# Patient Record
Sex: Female | Born: 2005 | Race: White | Hispanic: No | Marital: Single | Smoking: Never smoker
Health system: Southern US, Community
[De-identification: ages and names within clinical notes are randomized; demographics above are authoritative.]

## PROBLEM LIST (undated history)

## (undated) DIAGNOSIS — F319 Bipolar disorder, unspecified: Secondary | ICD-10-CM

## (undated) DIAGNOSIS — J3089 Other allergic rhinitis: Secondary | ICD-10-CM

## (undated) DIAGNOSIS — F988 Other specified behavioral and emotional disorders with onset usually occurring in childhood and adolescence: Secondary | ICD-10-CM

## (undated) DIAGNOSIS — E282 Polycystic ovarian syndrome: Secondary | ICD-10-CM

## (undated) DIAGNOSIS — F32A Depression, unspecified: Secondary | ICD-10-CM

## (undated) DIAGNOSIS — E7800 Pure hypercholesterolemia, unspecified: Secondary | ICD-10-CM

## (undated) DIAGNOSIS — F419 Anxiety disorder, unspecified: Secondary | ICD-10-CM

## (undated) DIAGNOSIS — M549 Dorsalgia, unspecified: Secondary | ICD-10-CM

## (undated) DIAGNOSIS — R7303 Prediabetes: Secondary | ICD-10-CM

## (undated) HISTORY — DX: Other allergic rhinitis: J30.89

## (undated) HISTORY — DX: Other specified behavioral and emotional disorders with onset usually occurring in childhood and adolescence: F98.8

## (undated) HISTORY — DX: Dorsalgia, unspecified: M54.9

## (undated) HISTORY — DX: Prediabetes: R73.03

## (undated) HISTORY — DX: Bipolar disorder, unspecified: F31.9

## (undated) HISTORY — DX: Polycystic ovarian syndrome: E28.2

## (undated) HISTORY — PX: MOUTH SURGERY: SHX715

## (undated) HISTORY — DX: Pure hypercholesterolemia, unspecified: E78.00

---
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
## 2023-03-16 ENCOUNTER — Ambulatory Visit: Payer: Medicaid Other | Admitting: Family Medicine

## 2023-03-16 ENCOUNTER — Encounter: Payer: Self-pay | Admitting: Family Medicine

## 2023-03-16 VITALS — BP 107/73 | HR 87 | Temp 98.1°F | Resp 16 | Ht 62.0 in | Wt 187.0 lb

## 2023-03-16 DIAGNOSIS — J209 Acute bronchitis, unspecified: Secondary | ICD-10-CM | POA: Diagnosis not present

## 2023-03-16 MED ORDER — BENZONATATE 100 MG PO CAPS: 100.0000 mg | ORAL_CAPSULE | Freq: Three times a day (TID) | ORAL | 0 refills | Status: DC | PRN

## 2023-03-16 MED ORDER — PREDNISONE 10 MG PO TABS: 10.0000 mg | ORAL_TABLET | Freq: Two times a day (BID) | ORAL | 0 refills | Status: DC

## 2023-03-16 NOTE — Progress Notes (Signed)
 Acute Office Visit  Subjective:     Patient ID: Nancy Stewart, female    DOB: 22-Oct-2005, 18 y.o.   MRN: [id number]  Chief Complaint  Patient presents with   Cough    Cough/  sinus congestion that has moved to the chest , pt report stuffy ears bi-lat,pt denies any any fevers , pt reports clear and green mucus .    HPI Patient is in today for an acute visit with cough, sinus congestion that feels like it is moving down to my chest. Ears feel stuffy, no pain, no hearing loss. Symptoms started on Friday.  No fever or chills. Does not feel ill.  No fatigue.  Reports some shortness of breath with climbing stairs. This is new. Walking on flat surface is fine.  Able to converse with provider without needing to stop. Frequent coughing during visit.   Review of Systems  Constitutional:  Negative for chills and fever.  HENT:  Positive for congestion. Negative for ear pain and hearing loss.   Respiratory:  Positive for cough and shortness of breath (with climbing stairs.).         Objective:    BP 107/73   Pulse 87   Temp 98.1 F (36.7 C) (Oral)   Resp 16   Ht 5' 2 (1.575 m)   Wt 187 lb (84.8 kg)   SpO2 99%   BMI 34.20 kg/m  BP Readings from Last 3 Encounters:  03/16/23 107/73  09/14/22 130/76  02/28/22 125/84      Physical Exam Vitals and nursing note reviewed.  Constitutional:      General: She is not in acute distress.    Appearance: Normal appearance.  HENT:     Right Ear: Tympanic membrane normal.     Left Ear: Tympanic membrane normal.     Nose:     Right Sinus: No maxillary sinus tenderness or frontal sinus tenderness.     Left Sinus: No maxillary sinus tenderness or frontal sinus tenderness.     Mouth/Throat:     Pharynx: Uvula midline. Posterior oropharyngeal erythema present. No oropharyngeal exudate.     Tonsils: 1+ on the right. 1+ on the left.  Cardiovascular:     Rate and Rhythm: Regular rhythm.     Heart sounds: Normal heart sounds.   Pulmonary:     Effort: Pulmonary effort is normal.     Breath sounds: Normal breath sounds.  Skin:    General: Skin is warm and dry.  Neurological:     General: No focal deficit present.     Mental Status: She is alert. Mental status is at baseline.  Psychiatric:        Mood and Affect: Mood normal.        Behavior: Behavior normal.        Thought Content: Thought content normal.        Judgment: Judgment normal.    No results found for any visits on 03/16/23.      Assessment & Plan:   Problem List Items Addressed This Visit     Acute bronchitis - Primary   Cough with URI symptoms present since Friday. No fever or chills. Does not feel ill. Lungs are clear. Oxygen saturation 99%. No obvious shortness of breath, reports some with climbing stairs. Prednisone  10 mg BID with food x 5 days. Benzonatate  100 mg TID for cough. Supportive therapy. Explained viral illness need to run their course. No indication today for chest x-ray. Follow-up if  symptoms are worse or still present in 7-10 days. Will send for chest x-ray at that time.       Relevant Medications   predniSONE  (DELTASONE ) 10 MG tablet   benzonatate  (TESSALON ) 100 MG capsule    Meds ordered this encounter  Medications   predniSONE  (DELTASONE ) 10 MG tablet    Sig: Take 1 tablet (10 mg total) by mouth 2 (two) times daily with a meal.    Dispense:  10 tablet    Refill:  0    Supervising Provider:   RUCKER, ALETHEA Y [[id number]]   benzonatate  (TESSALON ) 100 MG capsule    Sig: Take 1 capsule (100 mg total) by mouth 3 (three) times daily as needed for cough.    Dispense:  30 capsule    Refill:  0    Supervising Provider:   COLETTE TORRENCE GRADE [[id number]]  Agrees with plan of care discussed.  Questions answered.   Return if symptoms worsen or fail to improve.  Darice JONELLE Brownie, FNP

## 2023-03-16 NOTE — Assessment & Plan Note (Signed)
 Cough with URI symptoms present since Friday. No fever or chills. Does not feel ill. Lungs are clear. Oxygen saturation 99%. No obvious shortness of breath, reports some with climbing stairs. Prednisone  10 mg BID with food x 5 days. Benzonatate  100 mg TID for cough. Supportive therapy. Explained viral illness need to run their course. No indication today for chest x-ray. Follow-up if symptoms are worse or still present in 7-10 days. Will send for chest x-ray at that time.

## 2023-03-16 NOTE — Patient Instructions (Signed)
Viral illnesses can take up to 10 days to resolve.   There is no role for an antibiotic.  Symptomatic treatment is ideal.   Take over the counter pain medication as needed. Acetaminophen and ibuprofen for fever and body aches. Mucinex and Robitussin for cough, if you have high blood pressure, take Coricidin for cough. Honey is also effective for cough, avoid if diabetic. Flonase or saline nasal spray for nasal congestion.    Read and follow instructions on the label and make sure not to combine other medications that may have same ingredients in it. It is important to not take too much.    Drink plenty of caffeine-free fluids. (If you have heart or kidney problems, follow the instructions of your specialist regarding amounts). Adequate fluids will help you to avoid dehydration.   If you are hungry, eat a bland diet, such as the BRAT diet (bananas, rice, applesauce, toast). Diet as tolerated if appetite is normal.   Get lots of rest.  Let us know if you are not improving or getting worse.

## 2023-04-09 ENCOUNTER — Ambulatory Visit
Admission: EM | Admit: 2023-04-09 | Discharge: 2023-04-09 | Disposition: A | Discharge status: Home / Self Care | Payer: Medicaid Other | Attending: Family Medicine | Admitting: Family Medicine

## 2023-04-09 ENCOUNTER — Encounter: Payer: Self-pay | Admitting: Emergency Medicine

## 2023-04-09 ENCOUNTER — Other Ambulatory Visit: Payer: Self-pay

## 2023-04-09 DIAGNOSIS — R059 Cough, unspecified: Secondary | ICD-10-CM

## 2023-04-09 LAB — POC COVID19/FLU A&B COMBO
Covid Antigen, POC: NEGATIVE
Influenza A Antigen, POC: NEGATIVE
Influenza B Antigen, POC: NEGATIVE

## 2023-04-09 MED ORDER — PREDNISONE 20 MG PO TABS: ORAL_TABLET | ORAL | 0 refills | Status: DC

## 2023-04-09 NOTE — ED Provider Notes (Signed)
[facility]    CSN: [id number] Arrival date & time: 04/09/23  1239      History   Chief Complaint Chief Complaint  Patient presents with   Cough   Nasal Congestion    HPI Nancy Stewart is a 18 y.o. female.   HPI 18 year old female presents with cough, burning chest congestion and fatigue for 1 day.  PMH significant for obesity, depression, and nasal congestion with rhinorrhea.  Past Medical History:  Diagnosis Date   Anxiety    Depression    Environmental and seasonal allergies     Patient Active Problem List   Diagnosis Date Noted   Acute bronchitis 03/16/2023   Establishing care with new doctor, encounter for 09/14/2022   Nasal congestion with rhinorrhea 09/14/2022   Seasonal allergies 09/14/2022   Upper respiratory tract infection 09/14/2022    History reviewed. No pertinent surgical history.  OB History   No obstetric history on file.      Home Medications    Prior to Admission medications   Medication Sig Start Date End Date Taking? Authorizing Provider  CAPLYTA 42 MG capsule Take 42 mg by mouth at bedtime. 02/21/23  Yes [provider]  lamoTRIgine (LAMICTAL) 25 MG tablet Take 25 mg by mouth 2 (two) times daily. 02/20/23  Yes [provider]  predniSONE (DELTASONE) 20 MG tablet Take 3 tabs PO daily x 5 days. 04/09/23  Yes Trevor Iha, FNP    Family History Family History  Problem Relation Age of Onset   Healthy Mother    Healthy Father     Social History Social History   Tobacco Use   Smoking status: Never    Passive exposure: Never   Smokeless tobacco: Never  Vaping Use   Vaping status: Never Used  Substance Use Topics   Alcohol use: Never   Drug use: Never     Allergies   Patient has no known allergies.   Review of Systems Review of Systems  HENT:  Positive for congestion.   Respiratory:  Positive for cough.   All other systems reviewed and are negative.    Physical Exam Triage Vital  Signs ED Triage Vitals  Encounter Vitals Group     BP 04/09/23 1319 117/81     Systolic BP Percentile --      Diastolic BP Percentile --      Pulse Rate 04/09/23 1319 (!) 102     Resp 04/09/23 1319 16     Temp 04/09/23 1319 98.8 F (37.1 C)     Temp Source 04/09/23 1319 Oral     SpO2 04/09/23 1319 95 %     Weight --      Height --      Head Circumference --      Peak Flow --      Pain Score 04/09/23 1318 8     Pain Loc --      Pain Education --      Exclude from Growth Chart --    No data found.  Updated Vital Signs BP 117/81 (BP Location: Left Arm)   Pulse (!) 102   Temp 98.8 F (37.1 C) (Oral)   Resp 16   SpO2 95%    Physical Exam Vitals and nursing note reviewed.  Constitutional:      Appearance: Normal appearance. She is normal weight.  HENT:     Head: Normocephalic and atraumatic.     Right Ear: Tympanic membrane, ear canal and external ear  normal.     Left Ear: Tympanic membrane, ear canal and external ear normal.     Mouth/Throat:     Mouth: Mucous membranes are moist.     Pharynx: Oropharynx is clear.  Eyes:     Extraocular Movements: Extraocular movements intact.     Conjunctiva/sclera: Conjunctivae normal.     Pupils: Pupils are equal, round, and reactive to light.  Cardiovascular:     Rate and Rhythm: Normal rate and regular rhythm.     Pulses: Normal pulses.     Heart sounds: Normal heart sounds.  Pulmonary:     Effort: Pulmonary effort is normal.     Breath sounds: Normal breath sounds.  Musculoskeletal:        General: Normal range of motion.     Cervical back: Normal range of motion and neck supple.  Skin:    General: Skin is warm and dry.  Neurological:     General: No focal deficit present.     Mental Status: She is alert and oriented to person, place, and time. Mental status is at baseline.      UC Treatments / Results  Labs (all labs ordered are listed, but only abnormal results are displayed) Labs Reviewed  POC COVID19/FLU A&B  COMBO - Normal    EKG   Radiology No results found.  Procedures Procedures (including critical care time)  Medications Ordered in UC Medications - No data to display  Initial Impression / Assessment and Plan / UC Course  I have reviewed the triage vital signs and the nursing notes.  Pertinent labs & imaging results that were available during my care of the patient were reviewed by me and considered in my medical decision making (see chart for details).     Mdm: 1.  Cough, unspecified-Rx'd prednisone 20 mg tablet: Take 3 tablets p.o. daily x 5 days. Advised patient influenza/COVID-19 negative today.  Advised patient take medication as directed with food to completion.  Encouraged to increase daily water intake to 64 ounces per day while taking this medication.  Advised if symptoms worsen and/or unresolved please follow-up with your PCP.  Patient discharged home, hemodynamically stable Final Clinical Impressions(s) / UC Diagnoses   Final diagnoses:  Cough, unspecified type     Discharge Instructions      Advised patient influenza/COVID-19 negative today.  Advised patient take medication as directed with food to completion.  Encouraged to increase daily water intake to 64 ounces per day while taking this medication.  Advised if symptoms worsen and/or unresolved please follow-up with your PCP.     ED Prescriptions     Medication Sig Dispense Auth. Provider   predniSONE (DELTASONE) 20 MG tablet Take 3 tabs PO daily x 5 days. 15 tablet Trevor Iha, FNP      PDMP not reviewed this encounter.   Trevor Iha, FNP 04/09/23 1456

## 2023-04-09 NOTE — Discharge Instructions (Addendum)
Advised patient influenza/COVID-19 negative today.  Advised patient take medication as directed with food to completion.  Encouraged to increase daily water intake to 64 ounces per day while taking this medication.  Advised if symptoms worsen and/or unresolved please follow-up with your PCP.

## 2023-04-09 NOTE — ED Triage Notes (Signed)
Patient presents to Urgent Care with complaints of cough-burning, chest congestion, fatigued since 1 day ago. Patient reports not taking any medication otc.Marland Kitchen  Denies any fever or chills.

## 2023-04-10 ENCOUNTER — Encounter: Payer: Self-pay | Admitting: Family Medicine

## 2023-04-10 ENCOUNTER — Ambulatory Visit (INDEPENDENT_AMBULATORY_CARE_PROVIDER_SITE_OTHER): Payer: Medicaid Other | Admitting: Family Medicine

## 2023-04-10 VITALS — BP 134/75 | HR 92 | Temp 98.4°F | Resp 18 | Ht 62.0 in | Wt 184.7 lb

## 2023-04-10 DIAGNOSIS — R053 Chronic cough: Secondary | ICD-10-CM

## 2023-04-10 MED ORDER — AZITHROMYCIN 250 MG PO TABS: ORAL_TABLET | ORAL | 0 refills | Status: AC

## 2023-04-10 MED ORDER — ALBUTEROL SULFATE HFA 108 (90 BASE) MCG/ACT IN AERS: 2.0000 | INHALATION_SPRAY | Freq: Four times a day (QID) | RESPIRATORY_TRACT | 0 refills | Status: DC | PRN

## 2023-04-10 NOTE — Assessment & Plan Note (Addendum)
Persistent cough and symptoms since 03/15/13. Lungs are clear. No obvious shortness of breath or wheezing today. Reports shortness of breath with exertion.  Azithromycin 500 mg today, followed by 250 mg daily for next 4 days. Albuterol inhaler as instructed for shortness of breath. Oxygen saturation 98%.  Presumed upper respiratory infection, treat with antibiotics today. Follow-up if symptoms do not resolve. Explained course of cough and that it may last longer than illness. Complete prednisone given by urgent care.

## 2023-04-10 NOTE — Progress Notes (Signed)
Acute Office Visit  Subjective:     Patient ID: Nancy Stewart, female    DOB: Oct 10, 2005, 18 y.o.   MRN: [id number]  Chief Complaint  Patient presents with   Cough    Patient states that her cough started on Friday with a little congestion , by Saturday cough was worse. Patient states that cough is productive     HPI Patient is in today for productive cough with congestion. Symptoms started Friday. Went to urgent care yesterday and given prednisone 60 mg daily. Has taken one dose. Has not seen improvement in her cough.  May have spiked a fever on Friday, she did not have a way to test her. Reports exertional shortness of breath with extended walking. No obvious SOB in clinic today.  Treated for bronchitis on 03/16/23, symptoms improved at that time then returned.   03/16/23: acute bronchitis:  Prednisone 10 mg BID with benzonatate 100 mg TID as needed.       Review of Systems  Constitutional:  Positive for chills and fever.  HENT:  Positive for congestion.   Respiratory:  Positive for cough, sputum production and shortness of breath (with extended walking.). Negative for wheezing.         Objective:    BP 134/75   Pulse 92   Temp 98.4 F (36.9 C) (Oral)   Resp 18   Ht 5\' 2"  (1.575 m)   Wt 184 lb 11.2 oz (83.8 kg)   SpO2 98%   BMI 33.78 kg/m  BP Readings from Last 3 Encounters:  04/10/23 134/75  04/09/23 117/81  03/16/23 107/73      Physical Exam Vitals and nursing note reviewed.  Constitutional:      General: She is not in acute distress.    Appearance: Normal appearance. She is obese. She is not ill-appearing.  Cardiovascular:     Rate and Rhythm: Regular rhythm.     Heart sounds: Normal heart sounds.  Pulmonary:     Effort: Pulmonary effort is normal.     Breath sounds: Normal breath sounds.  Skin:    General: Skin is warm and dry.  Neurological:     General: No focal deficit present.     Mental Status: She is alert. Mental status is at  baseline.  Psychiatric:        Mood and Affect: Mood normal.        Behavior: Behavior normal.        Thought Content: Thought content normal.        Judgment: Judgment normal.    No results found for any visits on 04/10/23.      Assessment & Plan:   Problem List Items Addressed This Visit     Persistent cough for 3 weeks or longer - Primary   Persistent cough and symptoms since 03/15/13. Lungs are clear. No obvious shortness of breath or wheezing today. Reports shortness of breath with exertion.  Azithromycin 500 mg today, followed by 250 mg daily for next 4 days. Albuterol inhaler as instructed for shortness of breath. Oxygen saturation 98%.  Presumed upper respiratory infection, treat with antibiotics today. Follow-up if symptoms do not resolve. Explained course of cough and that it may last longer than illness. Complete prednisone given by urgent care.       Relevant Medications   azithromycin (ZITHROMAX) 250 MG tablet   albuterol (VENTOLIN HFA) 108 (90 Base) MCG/ACT inhaler    Meds ordered this encounter  Medications   azithromycin (ZITHROMAX)  250 MG tablet    Sig: Take 2 tablets on day 1, then 1 tablet daily on days 2 through 5    Dispense:  6 tablet    Refill:  0    Supervising Provider:   Suzan Slick [[id number]]   albuterol (VENTOLIN HFA) 108 (90 Base) MCG/ACT inhaler    Sig: Inhale 2 puffs into the lungs every 6 (six) hours as needed for wheezing or shortness of breath.    Dispense:  8 g    Refill:  0    Supervising Provider:   Suzan Slick [[id number]]  Agrees with plan of care discussed.  Questions answered.   Return if symptoms worsen or fail to improve.  Novella Olive, FNP

## 2023-04-10 NOTE — Patient Instructions (Signed)
  Take over the counter pain medication as needed. Acetaminophen and ibuprofen for fever and body aches. Mucinex and Robitussin for cough, if you have high blood pressure, take Coricidin for cough. Honey is also effective for cough, avoid if diabetic. Flonase or saline nasal spray for nasal congestion.    Read and follow instructions on the label and make sure not to combine other medications that may have same ingredients in it. It is important to not take too much.    Drink plenty of caffeine-free fluids. (If you have heart or kidney problems, follow the instructions of your specialist regarding amounts). Adequate fluids will help you to avoid dehydration.   If you are hungry, eat a bland diet, such as the BRAT diet (bananas, rice, applesauce, toast). Diet as tolerated if appetite is normal.   Get lots of rest.  Let us know if you are not improving or getting worse.

## 2023-04-11 ENCOUNTER — Encounter: Payer: Self-pay | Admitting: Family Medicine

## 2023-04-13 ENCOUNTER — Ambulatory Visit: Payer: Medicaid Other | Admitting: Family Medicine

## 2023-04-13 ENCOUNTER — Encounter: Payer: Self-pay | Admitting: Family Medicine

## 2023-04-13 VITALS — BP 101/65 | HR 90 | Temp 98.1°F | Resp 18 | Ht 62.0 in | Wt 185.8 lb

## 2023-04-13 DIAGNOSIS — J0100 Acute maxillary sinusitis, unspecified: Secondary | ICD-10-CM | POA: Diagnosis not present

## 2023-04-13 DIAGNOSIS — R053 Chronic cough: Secondary | ICD-10-CM | POA: Diagnosis not present

## 2023-04-13 MED ORDER — FLUTICASONE PROPIONATE 50 MCG/ACT NA SUSP: 2.0000 | Freq: Every day | NASAL | 6 refills | Status: DC

## 2023-04-13 MED ORDER — BENZONATATE 100 MG PO CAPS: 100.0000 mg | ORAL_CAPSULE | Freq: Two times a day (BID) | ORAL | 0 refills | Status: DC | PRN

## 2023-04-13 NOTE — Progress Notes (Signed)
Acute Office Visit  Subjective:     Patient ID: Nancy Stewart, female    DOB: 01-18-2006, 18 y.o.   MRN: [id number]  Chief Complaint  Patient presents with   Fever    Sinus congestion    HPI Patient is in today for continued congestion and fever. T-max: 102.1 last night x 1. Took ibuprofen 400 mg which helped. Feels like more in  her sinuses. Left side nostril with increased drainage.  Frequent cough in exam room, completed prednisone 60 mg daily for 5 days.  Day # 4 of azithromycin.  Drinking fluids No nausea or vomiting.  Day # 6 of symptoms with cough starting first.    Chart review:  03/16/23: bronchitis, prednisone  04/09/23: urgent care - prednisone 04/10/23: azithromycin and albuterol inhaler   Review of Systems  Constitutional:  Positive for fever.  HENT:  Positive for congestion and sinus pain.   Respiratory:  Positive for cough. Negative for shortness of breath and wheezing.         Objective:    BP 101/65 (BP Location: Left Arm, Cuff Size: Large)   Pulse 90   Temp 98.1 F (36.7 C)   Resp 18   Ht 5\' 2"  (1.575 m)   Wt 185 lb 12.8 oz (84.3 kg)   SpO2 98%   BMI 33.98 kg/m  BP Readings from Last 3 Encounters:  04/13/23 101/65  04/10/23 134/75  04/09/23 117/81      Physical Exam Vitals and nursing note reviewed.  Constitutional:      General: She is not in acute distress.    Appearance: Normal appearance. She is not ill-appearing.  HENT:     Right Ear: Tympanic membrane normal.     Left Ear: Tympanic membrane normal.     Nose:     Left Sinus: Maxillary sinus tenderness present.     Mouth/Throat:     Pharynx: Uvula midline. No oropharyngeal exudate, posterior oropharyngeal erythema or uvula swelling.  Cardiovascular:     Heart sounds: Normal heart sounds.  Pulmonary:     Effort: Pulmonary effort is normal.     Breath sounds: Normal breath sounds.  Skin:    General: Skin is warm and dry.  Neurological:     General: No focal deficit  present.     Mental Status: She is alert. Mental status is at baseline.  Psychiatric:        Mood and Affect: Mood normal.        Behavior: Behavior normal.        Thought Content: Thought content normal.        Judgment: Judgment normal.    No results found for any visits on 04/13/23.      Assessment & Plan:   Problem List Items Addressed This Visit     Persistent cough for 3 weeks or longer   Supportive therapy. Benzonatate 100 mg BID as needed. Elevate to sleep, humidify the air, and honey may also be effective to control cough.       Relevant Medications   benzonatate (TESSALON) 100 MG capsule   Acute non-recurrent maxillary sinusitis - Primary   Nasal congestion, left sided maxillary sinus pressure, fever x 1, symptoms are consistent with sinus infection. Azithromycin started on 04/10/23 by this provider.  This is day # 4 of treatment. Albuterol inhaler has helped shortness of breath. Completed prednisone 60 mg every day given by urgent care. This provider prescribed prednisone on 03/16/23 for bronchitis, will avoid  further steroid use for now. Recommend saline nasal spray followed by Flonase nasal spray. Supportive therapy. Discussed completing antibiotic, how cough will last longer than illness. Humidify the air, elevate to sleep, honey is effective to treat cough. Vital signs are stable. Ibuprofen as needed for fever and aches.       Relevant Medications   fluticasone (FLONASE) 50 MCG/ACT nasal spray   benzonatate (TESSALON) 100 MG capsule    Meds ordered this encounter  Medications   fluticasone (FLONASE) 50 MCG/ACT nasal spray    Sig: Place 2 sprays into both nostrils daily.    Dispense:  16 g    Refill:  6    Supervising Provider:   Suzan Slick [[id number]]   benzonatate (TESSALON) 100 MG capsule    Sig: Take 1 capsule (100 mg total) by mouth 2 (two) times daily as needed for cough.    Dispense:  30 capsule    Refill:  0    Supervising Provider:   Suzan Slick [[id number]]  Agrees with plan of care discussed.  Questions answered.   Return if symptoms worsen or fail to improve.  Novella Olive, FNP

## 2023-04-13 NOTE — Patient Instructions (Addendum)
Get some saline spray at pharmacy to clean out the nose as often as you would like to do. Flonase spray in each nostril daily.  Finish the antibiotic.

## 2023-04-13 NOTE — Assessment & Plan Note (Addendum)
Nasal congestion, left sided maxillary sinus pressure, fever x 1, symptoms are consistent with sinus infection. Azithromycin started on 04/10/23 by this provider.  This is day # 4 of treatment. Albuterol inhaler has helped shortness of breath. Completed prednisone 60 mg every day given by urgent care. This provider prescribed prednisone on 03/16/23 for bronchitis, will avoid further steroid use for now. Recommend saline nasal spray followed by Flonase nasal spray. Supportive therapy. Discussed completing antibiotic, how cough will last longer than illness. Humidify the air, elevate to sleep, honey is effective to treat cough. Vital signs are stable. Ibuprofen as needed for fever and aches.

## 2023-04-13 NOTE — Assessment & Plan Note (Signed)
Supportive therapy. Benzonatate 100 mg BID as needed. Elevate to sleep, humidify the air, and honey may also be effective to control cough.

## 2023-04-26 ENCOUNTER — Ambulatory Visit: Payer: Medicaid Other | Admitting: Family Medicine

## 2023-04-26 ENCOUNTER — Other Ambulatory Visit (HOSPITAL_COMMUNITY)
Admission: RE | Admit: 2023-04-26 | Discharge: 2023-04-26 | Disposition: A | Discharge status: Home / Self Care | Payer: Medicaid Other | Source: Ambulatory Visit | Attending: Family Medicine | Admitting: Family Medicine

## 2023-04-26 ENCOUNTER — Encounter: Payer: Self-pay | Admitting: Family Medicine

## 2023-04-26 VITALS — BP 112/75 | HR 80 | Ht 62.0 in | Wt 185.0 lb

## 2023-04-26 DIAGNOSIS — L709 Acne, unspecified: Secondary | ICD-10-CM

## 2023-04-26 DIAGNOSIS — N912 Amenorrhea, unspecified: Secondary | ICD-10-CM

## 2023-04-26 DIAGNOSIS — E282 Polycystic ovarian syndrome: Secondary | ICD-10-CM

## 2023-04-26 DIAGNOSIS — Z202 Contact with and (suspected) exposure to infections with a predominantly sexual mode of transmission: Secondary | ICD-10-CM

## 2023-04-26 DIAGNOSIS — Z3202 Encounter for pregnancy test, result negative: Secondary | ICD-10-CM | POA: Diagnosis not present

## 2023-04-26 LAB — POCT URINE PREGNANCY: Preg Test, Ur: NEGATIVE

## 2023-04-26 MED ORDER — DROSPIRENONE-ETHINYL ESTRADIOL 3-0.02 MG PO TABS: 1.0000 | ORAL_TABLET | Freq: Every day | ORAL | 11 refills | Status: AC

## 2023-04-26 NOTE — Progress Notes (Signed)
   GYNECOLOGY OFFICE VISIT NOTE  History:   Nancy Stewart is a 18 y.o. G0P0000 here today for amenorrhea.  She states last menses June 2024.  Has long hx of irregular periods however never this long.  She reports being told she may have PCOS in the past after she reports having [place] in 2020 showing polycystic ovaries.  In Care Everywhere there is radiology report from 07/30/19 for pelvic US w/o available attachment for report.  However OB report says transabd scan showed anteverted uterus, endometrium 6.35mm, no free fluid, and b/l ovaries normal.  No contraception used at this time. Upreg negative today in clinic. She denies any abnormal vaginal discharge, bleeding, pelvic pain or other concerns.    Past Medical History:  Diagnosis Date   Anxiety    Depression    Environmental and seasonal allergies     Past Surgical History:  Procedure Laterality Date   MOUTH SURGERY      The following portions of the patient's history were reviewed and updated as appropriate: allergies, current medications, past family history, past medical history, past social history, past surgical history and problem list.   Health Maintenance:  n/a 2/2 age   Review of Systems:  Pertinent items noted in HPI and remainder of comprehensive ROS otherwise negative.  Physical Exam:  BP 112/75   Pulse 80   Ht 5\' 2"  (1.575 m)   Wt 185 lb (83.9 kg)   BMI 33.84 kg/m  CONSTITUTIONAL: Well-developed, well-nourished female in no acute distress.  HEENT:  Normocephalic, atraumatic. External right and left ear normal. No scleral icterus.  NECK: Normal range of motion, supple, no masses noted on observation SKIN: No rash noted. Not diaphoretic. No erythema. No pallor. MUSCULOSKELETAL: Normal range of motion. No edema noted. NEUROLOGIC: Alert and oriented to person, place, and time. Normal muscle tone coordination.  PSYCHIATRIC: Normal mood and affect. Normal behavior. Normal judgment and thought content. CARDIOVASCULAR:  Normal heart rate noted RESPIRATORY: Effort and breath sounds normal, no problems with respiration noted ABDOMEN: No masses noted. No other overt distention noted.   PELVIC: Deferred  Labs and Imaging Results for orders placed or performed in visit on 04/26/23 (from the past week)  POCT urine pregnancy   Collection Time: 04/26/23 11:14 AM  Result Value Ref Range   Preg Test, Ur Negative Negative   No results found.    Assessment and Plan:      1. Exposure to sexually transmitted disease (STD) (Primary) - Cervicovaginal ancillary only( [place]) - RPR - HIV antibody (with reflex) - Hepatitis C Antibody - Hepatitis B Surface AntiGEN  2. Amenorrhea - POCT urine pregnancy - drospirenone-ethinyl estradiol (YAZ) 3-0.02 MG tablet; Take 1 tablet by mouth daily.  Dispense: 28 tablet; Refill: 11 - US OB Transvaginal; Future  3. PCOS (polycystic ovarian syndrome) - drospirenone-ethinyl estradiol (YAZ) 3-0.02 MG tablet; Take 1 tablet by mouth daily.  Dispense: 28 tablet; Refill: 11 - US OB Transvaginal; Future  4. Acne, unspecified acne type - drospirenone-ethinyl estradiol (YAZ) 3-0.02 MG tablet; Take 1 tablet by mouth daily.  Dispense: 28 tablet; Refill: 11     Routine preventative health maintenance measures emphasized. Please refer to After Visit Summary for other counseling recommendations.   Return in about 3 months (around 07/24/2023) for Amenorrhea follow up.     Hessie Dibble, MD OB Fellow, Faculty [facility] 04/26/2023 11:34 AM

## 2023-04-26 NOTE — Progress Notes (Signed)
Pt is new to office to discuss cycle- last cycle was June 2024. Pt states cycles are usually irregular but has not gone this long with no cycle.  Pt also states she has PCOS. Pt has no form of BC. UPT in office is negative today.  Pt request all STD screening today.

## 2023-04-27 LAB — CERVICOVAGINAL ANCILLARY ONLY
Bacterial Vaginitis (gardnerella): NEGATIVE
Candida Glabrata: NEGATIVE
Candida Vaginitis: NEGATIVE
Chlamydia: NEGATIVE
Comment: NEGATIVE
Comment: NEGATIVE
Comment: NEGATIVE
Comment: NEGATIVE
Comment: NEGATIVE
Comment: NORMAL
Neisseria Gonorrhea: NEGATIVE
Trichomonas: NEGATIVE

## 2023-04-27 LAB — RPR: RPR Ser Ql: NONREACTIVE

## 2023-04-27 LAB — HEPATITIS B SURFACE ANTIGEN: Hepatitis B Surface Ag: NEGATIVE

## 2023-04-27 LAB — HIV ANTIBODY (ROUTINE TESTING W REFLEX): HIV Screen 4th Generation wRfx: NONREACTIVE

## 2023-04-27 LAB — HEPATITIS C ANTIBODY: Hep C Virus Ab: NONREACTIVE

## 2023-05-02 ENCOUNTER — Other Ambulatory Visit: Payer: Self-pay | Admitting: *Deleted

## 2023-05-02 DIAGNOSIS — N912 Amenorrhea, unspecified: Secondary | ICD-10-CM

## 2023-05-02 DIAGNOSIS — E282 Polycystic ovarian syndrome: Secondary | ICD-10-CM

## 2023-05-02 NOTE — Progress Notes (Signed)
Change in u/s order per dept request.  U/s was entered as ob transvag and pt is not pregnant.

## 2023-05-03 ENCOUNTER — Ambulatory Visit (HOSPITAL_COMMUNITY): Admission: RE | Admit: 2023-05-03 | Payer: Medicaid Other | Source: Ambulatory Visit

## 2023-05-03 ENCOUNTER — Ambulatory Visit (HOSPITAL_COMMUNITY): Payer: Medicaid Other

## 2023-05-05 ENCOUNTER — Ambulatory Visit (HOSPITAL_COMMUNITY)
Admission: RE | Admit: 2023-05-05 | Discharge: 2023-05-05 | Disposition: A | Discharge status: Home / Self Care | Payer: Medicaid Other | Source: Ambulatory Visit | Attending: Family Medicine | Admitting: Family Medicine

## 2023-05-05 DIAGNOSIS — E282 Polycystic ovarian syndrome: Secondary | ICD-10-CM | POA: Insufficient documentation

## 2023-05-05 DIAGNOSIS — R9389 Abnormal findings on diagnostic imaging of other specified body structures: Secondary | ICD-10-CM | POA: Diagnosis not present

## 2023-05-05 DIAGNOSIS — N912 Amenorrhea, unspecified: Secondary | ICD-10-CM | POA: Diagnosis present

## 2023-05-07 ENCOUNTER — Other Ambulatory Visit (HOSPITAL_COMMUNITY): Payer: Self-pay | Admitting: Family Medicine

## 2023-05-07 DIAGNOSIS — R935 Abnormal findings on diagnostic imaging of other abdominal regions, including retroperitoneum: Secondary | ICD-10-CM

## 2023-05-07 NOTE — Progress Notes (Signed)
 Nancy Stewart,  I just got your results from your pelvic US.  Overall normal appearing, no masses or polycystic ovaries seen.  However, there was one area they couldn't rule out a small dilated piece of your fallopian tube versus could it have just be a section of small bowel in the way.  Out of abundance of caution I agee with their recommendations to repeat an [place] in 2-3 months to just in on this area. I've ordered this [place] as well.   Dr. Judd Lien

## 2023-05-30 ENCOUNTER — Ambulatory Visit: Payer: Medicaid Other | Admitting: Obstetrics and Gynecology

## 2023-05-30 ENCOUNTER — Encounter: Payer: Self-pay | Admitting: Obstetrics and Gynecology

## 2023-05-30 VITALS — BP 114/76 | HR 80 | Ht 62.0 in | Wt 181.0 lb

## 2023-05-30 DIAGNOSIS — Z68.41 Body mass index (BMI) pediatric, 5th percentile to less than 85th percentile for age: Secondary | ICD-10-CM | POA: Diagnosis not present

## 2023-05-30 DIAGNOSIS — Z6833 Body mass index (BMI) 33.0-33.9, adult: Secondary | ICD-10-CM | POA: Diagnosis not present

## 2023-05-30 DIAGNOSIS — E66811 Obesity, class 1: Secondary | ICD-10-CM | POA: Diagnosis not present

## 2023-05-30 DIAGNOSIS — E282 Polycystic ovarian syndrome: Secondary | ICD-10-CM

## 2023-05-30 NOTE — Progress Notes (Addendum)
 18 y.o. GYN presents for Follow Up.  Pt wants Blood work done today.  C/o amenorrhea.

## 2023-05-30 NOTE — Progress Notes (Signed)
 18 yo P0 with BMI 33 and LMP 05/23/23 presenting today for follow up. Patient with history of oligomenorrhea suspected to be due to PCOS. She reports amenorrhea for 8 months and was started on birth control pills last month which caused her to have a cycle in March. She is without complaints. She denies pelvic pain or abnormal discharge. She is not currently sexually active.  Past Medical History:  Diagnosis Date   Anxiety    Depression    Environmental and seasonal allergies    Past Surgical History:  Procedure Laterality Date   MOUTH SURGERY     Family History  Problem Relation Age of Onset   Healthy Father    Healthy Mother    Cancer Maternal Aunt    Social History   Tobacco Use   Smoking status: Never    Passive exposure: Never   Smokeless tobacco: Never  Vaping Use   Vaping status: Never Used  Substance Use Topics   Alcohol use: Never   Drug use: Never   ROS See pertinent in HPI. All other systems reviewed and non contributory Blood pressure 114/76, pulse 80, height 5\' 2"  (1.575 m), weight 181 lb (82.1 kg). GENERAL: Well-developed, well-nourished female in no acute distress.  NEURO: alert and oriented x 3  US PELVIC COMPLETE WITH TRANSVAGINAL Result Date: 05/07/2023 CLINICAL DATA:  Amenorrhea EXAM: TRANSABDOMINAL AND TRANSVAGINAL ULTRASOUND OF PELVIS TECHNIQUE: Both transabdominal and transvaginal ultrasound examinations of the pelvis were performed. Transabdominal technique was performed for global imaging of the pelvis including uterus, ovaries, adnexal regions, and pelvic cul-de-sac. It was necessary to proceed with endovaginal exam following the transabdominal exam to visualize the adnexal structures. COMPARISON:  None Available. FINDINGS: Uterus Measurements: 8.0 x 3.0 x 4.3 cm = volume: 53.4 mL. No fibroids or other mass visualized. Endometrium Thickness: 8 mm.  No focal abnormality visualized. Right ovary Measurements: 2.4 x 1.6 x 1.9 cm = volume: 3.6 mL. Normal  appearance/no adnexal mass. Left ovary Measurements: 2.4 x 1.3 x 2.4 cm = volume: 4.0 mL. Normal appearance/no adnexal mass. Other findings Nonspecific 2.4 x 3.9 x 2.9 cm tubular hypoechoic structure in the right adnexa. Small amount of free fluid in the pelvis. IMPRESSION: 1. Nonspecific 2.4 x 3.9 x 2.9 cm tubular hypoechoic structure in the right adnexa. This may represent a mildly dilated fallopian tube, small area of bowel or other structure. Consider follow-up pelvic ultrasound in 2-3 months. 2. Small amount of free fluid in the pelvis. Electronically Signed   By: Annia Belt M.D.   On: 05/07/2023 18:50     A/P 18 yo with amenorrhea - Encouraged patient to continue birth control pills for cycle control - Patient reffered to nutritionist - Information on PCOS provided - Labs ordered to rule out other etiology of amenorrhea - Patient will be contacted with abnormal results

## 2023-05-31 ENCOUNTER — Encounter: Payer: Self-pay | Admitting: Dietician

## 2023-05-31 ENCOUNTER — Encounter: Attending: Obstetrics and Gynecology | Admitting: Dietician

## 2023-05-31 VITALS — Wt 180.0 lb

## 2023-05-31 DIAGNOSIS — E282 Polycystic ovarian syndrome: Secondary | ICD-10-CM | POA: Insufficient documentation

## 2023-05-31 NOTE — Progress Notes (Signed)
 Medical Nutrition Therapy  Appointment Start time:  1600  Appointment End time:  1640  Primary concerns today: PCOS and nutrition   Referral diagnosis: PCOS Preferred learning style: no preference indicated Learning readiness: ready   NUTRITION ASSESSMENT   Anthropometrics   Wt: 180 lb  Clinical Medical Hx: anxiety, depression, PCOS Medications: reviewed Labs: reviewed Notable Signs/Symptoms: none reported Food Allergies: none reported  Lifestyle & Dietary Hx  Pt states she was referred by OBGYN due to recent PCOS diagnosis and did not have menstrual cycle for 8 months. Pt reports she wants to learn how she should be eating to best impact her health.   Pt reports she tends to be higher stress. 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 Pt reports she also deals with financial stress, and is moving soon which is bringing her stress. Pt reports she talks to a psychiatrist.   Pt states her mom has prediabetes and PCOS. Pt reports she is usually the one who does the cooking at home and enjoys making pasta dishes. Pt is not picky and reports she likes a variety of vegetables (not mushroom and onion).   Outside of school/work, pt enjoys reading and being outdoors. Pt states she tries to ride her bike 2x/wk for 45 minutes, and will go hiking once a month with her friends.   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  As evidenced by pt report.   NUTRITION INTERVENTION  Nutrition education (E-1) on the following topics:   Plate Method Fruits & Vegetables: Aim to fill half your plate with a variety of fruits and vegetables. They are rich in vitamins, minerals, and fiber, and can help reduce the risk of chronic diseases. Choose a colorful assortment of fruits and vegetables to ensure you get a wide range of nutrients. Grains and Starches: Make at least half of your grain choices whole grains, such as brown rice, whole wheat bread, and oats. Whole grains provide fiber, which aids in digestion and healthy cholesterol levels. Aim for whole forms of starchy vegetables such as potatoes, sweet potatoes, beans, peas, and corn, which are fiber rich and provide many vitamins and minerals.  Protein: Incorporate lean sources of protein, such as poultry, fish, beans, nuts, and seeds, into your meals. Protein is essential for building and repairing tissues, staying full, balancing blood sugar, as well as supporting immune function. Dairy: Include low-fat or fat-free dairy products like milk, yogurt, and cheese in your diet. Dairy foods are excellent sources of calcium and vitamin D, which are crucial for bone health.   Nutritional Strategies for PCOS 1. Balance Blood Sugar & Insulin Levels - Low Glycemic Index (GI) Foods: Choose whole grains, legumes, and non-starchy vegetables to prevent blood sugar spikes. - Fiber-Rich Diet: Helps slow down glucose absorption. Include foods like flaxseeds, chia seeds, lentils, leafy greens, and berries. - Healthy Fats: Avocados, nuts, seeds, and olive oil.  2. Prioritize Protein & Healthy Fats -  Lean Proteins: Chicken, tofu, eggs, fish, and legumes support muscle mass and help regulate hunger. - Avoid Trans Fats: Found in processed foods, fried items, and margarine, as they can worsen inflammation and insulin resistance.  3. Reduce Processed Carbs &  Sugar - Limit Sugary Beverages & Refined Carbs: Soda, white bread, pastries, and candy contribute to insulin resistance. - Use Natural Sweeteners Sparingly: Honey, stevia, and monk fruit are better alternatives in moderation.  4. Stay Hydrated & Manage Stress - Drink Plenty of Water. - Manage Stress: Chronic stress affects cortisol and insulin levels, worsening PCOS symptoms. Mindful eating, yoga, and meditation can help.   Handouts Provided Include  Inositol and PCOS  Learning Style & Readiness for Change Teaching method utilized: Visual & Auditory  Demonstrated degree of understanding via: Teach Back  Barriers to learning/adherence to lifestyle change: none  Goals Established by Pt  Goal 1: aim to eat 2 servings of non-starchy vegetables per day.    MONITORING & EVALUATION Dietary intake, weekly physical activity, and follow up in 2 months.  Next Steps  Patient is to call for questions.

## 2023-05-31 NOTE — Patient Instructions (Addendum)
 Goal 1: aim to eat 2 servings of non-starchy vegetables per day.   Nutritional Strategies for PCOS 1. Balance Blood Sugar & Insulin Levels - Low Glycemic Index (GI) Foods: Choose whole grains, legumes, and non-starchy vegetables to prevent blood sugar spikes. - Fiber-Rich Diet: Helps slow down glucose absorption. Include foods like flaxseeds, chia seeds, lentils, leafy greens, and berries. - Healthy Fats: Avocados, nuts, seeds, and olive oil.  2. Prioritize Protein & Healthy Fats - Lean Proteins: Chicken, tofu, eggs, fish, and legumes support muscle mass and help regulate hunger. - Avoid Trans Fats: Found in processed foods, fried items, and margarine, as they can worsen inflammation and insulin resistance.  3. Reduce Processed Carbs & Sugar - Limit Sugary Beverages & Refined Carbs: Soda, white bread, pastries, and candy contribute to insulin resistance. - Use Natural Sweeteners Sparingly: Honey, stevia, and monk fruit are better alternatives in moderation.  4. Stay Hydrated & Manage Stress - Drink Plenty of Water. - Manage Stress: Chronic stress affects cortisol and insulin levels, worsening PCOS symptoms. Mindful eating, yoga, and meditation can help.

## 2023-06-04 ENCOUNTER — Encounter: Payer: Self-pay | Admitting: Obstetrics and Gynecology

## 2023-06-04 LAB — TSH+PRL+FSH+TESTT+LH+DHEA S...
17-Hydroxyprogesterone: 81 ng/dL
Androstenedione: 216 ng/dL (ref 41–262)
DHEA-SO4: 113 ug/dL (ref 110.0–433.2)
FSH: 4.4 m[IU]/mL
LH: 9.6 m[IU]/mL
Prolactin: 29.1 ng/mL (ref 4.8–33.4)
TSH: 1.17 u[IU]/mL (ref 0.450–4.500)
Testosterone, Free: 1.3 pg/mL
Testosterone: 47 ng/dL (ref 13–71)

## 2023-06-05 ENCOUNTER — Encounter: Payer: Self-pay | Admitting: Family Medicine

## 2023-06-06 ENCOUNTER — Encounter: Payer: Self-pay | Admitting: Family Medicine

## 2023-06-06 ENCOUNTER — Ambulatory Visit (INDEPENDENT_AMBULATORY_CARE_PROVIDER_SITE_OTHER): Admitting: Family Medicine

## 2023-06-06 VITALS — BP 113/76 | HR 95 | Temp 98.0°F | Resp 18 | Ht 62.0 in | Wt 181.7 lb

## 2023-06-06 DIAGNOSIS — Z0000 Encounter for general adult medical examination without abnormal findings: Secondary | ICD-10-CM | POA: Diagnosis not present

## 2023-06-06 DIAGNOSIS — Z13220 Encounter for screening for lipoid disorders: Secondary | ICD-10-CM

## 2023-06-06 DIAGNOSIS — Z1329 Encounter for screening for other suspected endocrine disorder: Secondary | ICD-10-CM | POA: Diagnosis not present

## 2023-06-06 DIAGNOSIS — Z136 Encounter for screening for cardiovascular disorders: Secondary | ICD-10-CM

## 2023-06-06 DIAGNOSIS — Z13228 Encounter for screening for other metabolic disorders: Secondary | ICD-10-CM

## 2023-06-06 NOTE — Progress Notes (Signed)
 Complete physical exam  Patient: Nancy Stewart   DOB: 03/14/2006   17 y.o. Female  MRN: [id number]  Subjective:    Chief Complaint  Patient presents with   Annual Exam    Patient is here for her annual physical and she is fasting    Nancy Stewart is a 18 y.o. female who presents today for a complete physical exam. She reports consuming a general diet.  walking  She generally feels fairly well. She reports sleeping poorly. She does not have additional problems to discuss today.    Most recent fall risk assessment:    05/31/2023    4:00 PM  Fall Risk   Falls in the past year? 0     Most recent depression screenings:    05/31/2023    4:00 PM 04/26/2023   10:52 AM  PHQ 2/9 Scores  PHQ - 2 Score 2 0    Vision:Within last year and Dental: No current dental problems and No regular dental care     Patient Care Team: Novella Olive, FNP as PCP - General (Family Medicine)   Outpatient Medications Prior to Visit  Medication Sig   CAPLYTA 42 MG capsule Take 42 mg by mouth at bedtime.   drospirenone-ethinyl estradiol (YAZ) 3-0.02 MG tablet Take 1 tablet by mouth daily.   fluticasone (FLONASE) 50 MCG/ACT nasal spray Place 2 sprays into both nostrils daily.   lamoTRIgine (LAMICTAL) 150 MG tablet Take 150 mg by mouth daily.   [DISCONTINUED] albuterol (VENTOLIN HFA) 108 (90 Base) MCG/ACT inhaler Inhale 2 puffs into the lungs every 6 (six) hours as needed for wheezing or shortness of breath.   [DISCONTINUED] benzonatate (TESSALON) 100 MG capsule Take 1 capsule (100 mg total) by mouth 2 (two) times daily as needed for cough. (Patient not taking: Reported on 05/30/2023)   [DISCONTINUED] lamoTRIgine (LAMICTAL) 25 MG tablet Take 25 mg by mouth 2 (two) times daily.   [DISCONTINUED] predniSONE (DELTASONE) 20 MG tablet Take 3 tabs PO daily x 5 days.   No facility-administered medications prior to visit.    ROS        Objective:     BP 113/76   Pulse 95   Temp 98 F (36.7  C) (Oral)   Resp 18   Ht 5\' 2"  (1.575 m)   Wt 181 lb 11.2 oz (82.4 kg)   SpO2 98%   BMI 33.23 kg/m  BP Readings from Last 3 Encounters:  06/06/23 113/76  05/30/23 114/76  04/26/23 112/75      Physical Exam Vitals and nursing note reviewed.  Constitutional:      General: She is not in acute distress.    Appearance: Normal appearance.  HENT:     Right Ear: Tympanic membrane normal.     Left Ear: Tympanic membrane normal.     Nose: Nose normal.     Mouth/Throat:     Mouth: Mucous membranes are moist.     Pharynx: Oropharynx is clear.  Eyes:     Extraocular Movements: Extraocular movements intact.  Neck:     Thyroid: No thyroid tenderness.  Cardiovascular:     Rate and Rhythm: Normal rate and regular rhythm.     Pulses:          Radial pulses are 2+ on the right side and 2+ on the left side.     Heart sounds: Normal heart sounds, S1 normal and S2 normal.  Pulmonary:     Effort: Pulmonary effort is  normal.     Breath sounds: Normal breath sounds.  Abdominal:     General: Bowel sounds are normal.     Palpations: Abdomen is soft.     Tenderness: There is no abdominal tenderness.  Genitourinary:    Vagina: Normal.     Cervix: Normal.     Adnexa: Right adnexa normal and left adnexa normal.  Musculoskeletal:        General: Normal range of motion.     Cervical back: Normal range of motion.     Right lower leg: No edema.     Left lower leg: No edema.  Lymphadenopathy:     Cervical:     Right cervical: No superficial cervical adenopathy.    Left cervical: No superficial cervical adenopathy.  Skin:    General: Skin is warm and dry.  Neurological:     General: No focal deficit present.     Mental Status: She is alert. Mental status is at baseline.  Psychiatric:        Mood and Affect: Mood normal.        Behavior: Behavior normal.        Thought Content: Thought content normal.        Judgment: Judgment normal.      No results found for any visits on  06/06/23.     Assessment & Plan:    Routine Health Maintenance and Physical Exam   There is no immunization history on file for this patient.  Health Maintenance  Topic Date Due   HPV VACCINES (1 - 3-dose series) Never done   DTaP/Tdap/Td (1 - Tdap) Never done   INFLUENZA VACCINE  10/13/2022   COVID-19 Vaccine (3 - 2024-25 season) 11/13/2022   Hepatitis C Screening  Completed   HIV Screening  Completed    Discussed health benefits of physical activity, and encouraged her to engage in regular exercise appropriate for her age and condition.  Annual physical exam -     CBC with Differential/Platelet -     Comprehensive metabolic panel  Encounter for lipid screening for cardiovascular disease -     Lipid panel  Encounter for screening for metabolic disorder -     Comprehensive metabolic panel -     Hemoglobin A1c  Screening for thyroid disorder -     TSH + free T4      Routine labs ordered.  HCM reviewed/discussed. Anticipatory guidance regarding healthy weight, lifestyle and choices given. Recommend healthy diet.  Recommend approximately 150 minutes/week of moderate intensity exercise. Resistance training is good for building muscles and for bone health. Muscle mass helps to increase our metabolism and to burn more calories at rest.  Limit alcohol consumption: no more than one drink per day for women and 2 drinks per day for me. Recommend regular dental and vision exams. Always use seatbelt/lap and shoulder restraints. Recommend using smoke alarms and checking batteries at least twice a year. Recommend using sunscreen when outside.  Please know that I am here to help you with all of your health care goals and am happy to work with you to find a solution that works best for you.  The greatest advice I have received with any changes in life are to take it one step at a time, that even means if all you can focus on is the next 60 seconds, then do that and celebrate your  victories.  With any changes in life, you will have set backs, and that is OK.  The important thing to remember is, if you have a set back, it is not a failure, it is an opportunity to try again! Agrees with plan of care discussed.  Questions answered.      Return in about 1 year (around 06/05/2024) for CPE with labs.     Novella Olive, FNP

## 2023-06-07 ENCOUNTER — Encounter: Payer: Self-pay | Admitting: Family Medicine

## 2023-06-07 LAB — COMPREHENSIVE METABOLIC PANEL
ALT: 9 IU/L (ref 0–32)
AST: 15 IU/L (ref 0–40)
Albumin: 4.3 g/dL (ref 4.0–5.0)
Alkaline Phosphatase: 64 IU/L (ref 42–106)
BUN/Creatinine Ratio: 15 (ref 9–23)
BUN: 13 mg/dL (ref 6–20)
Bilirubin Total: 0.2 mg/dL (ref 0.0–1.2)
CO2: 24 mmol/L (ref 20–29)
Calcium: 9.6 mg/dL (ref 8.7–10.2)
Chloride: 103 mmol/L (ref 96–106)
Creatinine, Ser: 0.84 mg/dL (ref 0.57–1.00)
Globulin, Total: 2.2 g/dL (ref 1.5–4.5)
Glucose: 86 mg/dL (ref 70–99)
Potassium: 4.6 mmol/L (ref 3.5–5.2)
Sodium: 140 mmol/L (ref 134–144)
Total Protein: 6.5 g/dL (ref 6.0–8.5)
eGFR: 103 mL/min/{1.73_m2} (ref >59)

## 2023-06-07 LAB — CBC WITH DIFFERENTIAL/PLATELET
Basophils Absolute: 0 10*3/uL (ref 0.0–0.2)
Basos: 0 %
EOS (ABSOLUTE): 0.1 10*3/uL (ref 0.0–0.4)
Eos: 1 %
Hematocrit: 39.6 % (ref 34.0–46.6)
Hemoglobin: 12.8 g/dL (ref 11.1–15.9)
Immature Grans (Abs): 0 10*3/uL (ref 0.0–0.1)
Immature Granulocytes: 0 %
Lymphocytes Absolute: 4 10*3/uL — ABNORMAL HIGH (ref 0.7–3.1)
Lymphs: 54 %
MCH: 29.4 pg (ref 26.6–33.0)
MCHC: 32.3 g/dL (ref 31.5–35.7)
MCV: 91 fL (ref 79–97)
Monocytes Absolute: 0.5 10*3/uL (ref 0.1–0.9)
Monocytes: 7 %
Neutrophils Absolute: 2.8 10*3/uL (ref 1.4–7.0)
Neutrophils: 38 %
Platelets: 243 10*3/uL (ref 150–450)
RBC: 4.35 x10E6/uL (ref 3.77–5.28)
RDW: 12.2 % (ref 11.7–15.4)
WBC: 7.5 10*3/uL (ref 3.4–10.8)

## 2023-06-07 LAB — HEMOGLOBIN A1C
Est. average glucose Bld gHb Est-mCnc: 117 mg/dL
Hgb A1c MFr Bld: 5.7 % — ABNORMAL HIGH (ref 4.8–5.6)

## 2023-06-07 LAB — LIPID PANEL
Chol/HDL Ratio: 4.1 ratio (ref 0.0–4.4)
Cholesterol, Total: 173 mg/dL — ABNORMAL HIGH (ref 100–169)
HDL: 42 mg/dL (ref >39)
LDL Chol Calc (NIH): 100 mg/dL (ref 0–109)
Triglycerides: 178 mg/dL — ABNORMAL HIGH (ref 0–89)
VLDL Cholesterol Cal: 31 mg/dL (ref 5–40)

## 2023-06-07 LAB — TSH+FREE T4
Free T4: 1.07 ng/dL (ref 0.93–1.60)
TSH: 3.01 u[IU]/mL (ref 0.450–4.500)

## 2023-06-28 ENCOUNTER — Encounter: Payer: Self-pay | Admitting: Family Medicine

## 2023-06-28 ENCOUNTER — Other Ambulatory Visit: Payer: Self-pay | Admitting: Family Medicine

## 2023-06-28 DIAGNOSIS — D72820 Lymphocytosis (symptomatic): Secondary | ICD-10-CM | POA: Insufficient documentation

## 2023-07-07 ENCOUNTER — Encounter: Payer: Self-pay | Admitting: Family Medicine

## 2023-07-07 LAB — CBC WITH DIFFERENTIAL/PLATELET
Basophils Absolute: 0 10*3/uL (ref 0.0–0.2)
Basos: 0 %
EOS (ABSOLUTE): 0.1 10*3/uL (ref 0.0–0.4)
Eos: 1 %
Hematocrit: 39.3 % (ref 34.0–46.6)
Hemoglobin: 12.9 g/dL (ref 11.1–15.9)
Immature Grans (Abs): 0 10*3/uL (ref 0.0–0.1)
Immature Granulocytes: 0 %
Lymphocytes Absolute: 2.5 10*3/uL (ref 0.7–3.1)
Lymphs: 38 %
MCH: 30.1 pg (ref 26.6–33.0)
MCHC: 32.8 g/dL (ref 31.5–35.7)
MCV: 92 fL (ref 79–97)
Monocytes Absolute: 0.5 10*3/uL (ref 0.1–0.9)
Monocytes: 8 %
Neutrophils Absolute: 3.4 10*3/uL (ref 1.4–7.0)
Neutrophils: 53 %
Platelets: 294 10*3/uL (ref 150–450)
RBC: 4.28 x10E6/uL (ref 3.77–5.28)
RDW: 12.3 % (ref 11.7–15.4)
WBC: 6.5 10*3/uL (ref 3.4–10.8)

## 2023-08-01 ENCOUNTER — Encounter: Attending: Obstetrics and Gynecology | Admitting: Dietician

## 2023-08-01 DIAGNOSIS — E282 Polycystic ovarian syndrome: Secondary | ICD-10-CM | POA: Insufficient documentation

## 2023-08-01 NOTE — Progress Notes (Signed)
 Medical Nutrition Therapy  Appointment Start time:  48 Appointment End time:  1122  Primary concerns today: PCOS and nutrition   Referral diagnosis: PCOS Preferred learning style: no preference indicated Learning readiness: ready   NUTRITION ASSESSMENT   Anthropometrics   Wt 05/31/23: 180 lb  Clinical Medical Hx: anxiety, depression, PCOS, prediabetes, HLD Medications: reviewed Labs: 06/06/23: 5.7%, total chol 173, triglycerides 178 Notable Signs/Symptoms: none reported Food Allergies: none reported  Lifestyle & Dietary Hx  Pt reports following previous visit she saw her PCP and had her A1c checked, along with lipid panel and had an A1c of 5.7, along with noticing her cholesterol and triglycerides were high. Pt reports this led her to making further changes.   Pt states she has been aiming to increase fruit and vegetable consumption, along with lean proteins. Pt reports she has been feeling more energetic.  Pt reports she is not working at [employer]. Pt previously would eat mcd[employer]ds for dinner (free meal), but now that she has switched jobs is cooking at home. Pt reports her new work schedule is 9am-6:30pm, or 7:30am-4pm.   Pt states she has increased her exercise, previously exercising 2x/wk, and now walking most days after work for about 45 minutes.   Estimated daily fluid intake: 80-120 oz Supplements: none reported Sleep: 12am-9am Stress / self-care: moderate stress Current average weekly physical activity: biking 2x/wk for 30-45 minutes, occasional hiking  24-Hr Dietary Recall First Meal: 10am: eggs and veggies and apple/banana Snack: none  Second Meal: 1:30pm: leftovers from dinner OR sandwich ([place] and cheese) and apple Snack: kettle chips Third Meal: chicken or pork chop and vegetables (corn, green beans, mashed potatoes) Snack:  Beverages: 1 poppi, 80-120 oz water   NUTRITION DIAGNOSIS  NB-1.1 Food and nutrition-related knowledge deficit As  related to lack of prior education by a registered dietitian.  As evidenced by pt report.   NUTRITION INTERVENTION  Nutrition education (E-1) on the following topics:   Assessed list of non-starchy vegetables and identified patients' preferred ones, including broccoli, green beans, cucumber, celery, eggplant, squash, tomato, salad greens, and peppers.   Prediabetes Prediabetes: Prediabetes is a condition where blood sugar levels are higher than normal but not yet high enough to be diagnosed as type 2 diabetes. A1C, or hemoglobin A1c, is a blood test that provides an average of a person's blood sugar levels over the past two to three months. It is commonly used to diagnose and monitor diabetes. For prediabetes, an A1C level between 5.7% and 6.4% typically is used to diagnose this. Here is how the A1C levels are generally categorized: Normal:  A1C below 5.7% Prediabetes:  A1C between 5.7% and 6.4% Diabetes:  A1C of 6.5% or higher When diagnosed with prediabetes, there are several lifestyle changes you can make to manage the condition: Healthy Eating:  Follow a well-balanced diet that includes a variety of fruits, vegetables, whole grains, lean proteins, and healthy fats. Monitor portion sizes and reduce intake of sugary and processed foods. Regular Physical Activity:  Engage in regular physical activity, such as brisk walking, cycling, or other aerobic exercises, for at least 150 minutes per week. Include strength training exercises at least twice a week. Weight Management: Achieve and maintain a healthy weight. Losing even a small amount of weight (3-5%) can significantly improve insulin sensitivity.  Plate Method Fruits & Vegetables: Aim to fill half your plate with a variety of fruits and vegetables. They are rich in vitamins, minerals, and fiber, and can help reduce the  risk of chronic diseases. Choose a colorful assortment of fruits and vegetables to ensure you get a wide range of  nutrients. Grains and Starches: Make at least half of your grain choices whole grains, such as brown rice, whole wheat bread, and oats. Whole grains provide fiber, which aids in digestion and healthy cholesterol levels. Aim for whole forms of starchy vegetables such as potatoes, sweet potatoes, beans, peas, and corn, which are fiber rich and provide many vitamins and minerals.  Protein: Incorporate lean sources of protein, such as poultry, fish, beans, nuts, and seeds, into your meals. Protein is essential for building and repairing tissues, staying full, balancing blood sugar, as well as supporting immune function. Dairy: Include low-fat or fat-free dairy products like milk, yogurt, and cheese in your diet. Dairy foods are excellent sources of calcium and vitamin D, which are crucial for bone health.   Nutritional Strategies for PCOS 1. Balance Blood Sugar & Insulin Levels - Low Glycemic Index (GI) Foods: Choose whole grains, legumes, and non-starchy vegetables to prevent blood sugar spikes. - Fiber-Rich Diet: Helps slow down glucose absorption. Include foods like flaxseeds, chia seeds, lentils, leafy greens, and berries. - Healthy Fats: Avocados, nuts, seeds, and olive oil.  2. Prioritize Protein & Healthy Fats - Lean Proteins: Chicken, tofu, eggs, fish, and legumes support muscle mass and help regulate hunger. - Avoid Trans Fats: Found in processed foods, fried items, and margarine, as they can worsen inflammation and insulin resistance.  3. Reduce Processed Carbs & Sugar - Limit Sugary Beverages & Refined Carbs: Soda, white bread, pastries, and candy contribute to insulin resistance. - Use Natural Sweeteners Sparingly: Honey, stevia, and monk fruit are better alternatives in moderation.  4. Stay Hydrated & Manage Stress - Drink Plenty of Water. - Manage Stress: Chronic stress affects cortisol and insulin levels, worsening PCOS symptoms. Mindful eating, yoga, and meditation can  help.  Handouts Provided Include (initial assessment) Inositol and PCOS  Learning Style & Readiness for Change Teaching method utilized: Visual & Auditory  Demonstrated degree of understanding via: Teach Back  Barriers to learning/adherence to lifestyle change: none  Goals Established by Pt  Assessment and continuation of previous goal:  Goal 1: aim to eat 2 servings of non-starchy vegetables per day. - goal in progress, continue working toward adding in another serving.   New Goal:   Maintain walking most days after work.    MONITORING & EVALUATION Dietary intake, weekly physical activity, and follow up in 4-5 months  Next Steps  Patient is to call for questions.

## 2023-08-15 ENCOUNTER — Encounter: Payer: Self-pay | Admitting: Obstetrics and Gynecology

## 2023-08-28 ENCOUNTER — Ambulatory Visit (INDEPENDENT_AMBULATORY_CARE_PROVIDER_SITE_OTHER): Admitting: Family Medicine

## 2023-08-28 ENCOUNTER — Ambulatory Visit (HOSPITAL_COMMUNITY)
Admission: RE | Admit: 2023-08-28 | Discharge: 2023-08-28 | Disposition: A | Discharge status: Home / Self Care | Source: Ambulatory Visit | Attending: Family Medicine | Admitting: Family Medicine

## 2023-08-28 ENCOUNTER — Encounter: Payer: Self-pay | Admitting: Family Medicine

## 2023-08-28 VITALS — BP 113/68 | HR 95 | Temp 98.6°F | Resp 20 | Ht 62.0 in | Wt 186.4 lb

## 2023-08-28 DIAGNOSIS — Z6834 Body mass index (BMI) 34.0-34.9, adult: Secondary | ICD-10-CM | POA: Diagnosis not present

## 2023-08-28 DIAGNOSIS — Z23 Encounter for immunization: Secondary | ICD-10-CM | POA: Diagnosis not present

## 2023-08-28 DIAGNOSIS — E66811 Obesity, class 1: Secondary | ICD-10-CM | POA: Diagnosis not present

## 2023-08-28 DIAGNOSIS — R935 Abnormal findings on diagnostic imaging of other abdominal regions, including retroperitoneum: Secondary | ICD-10-CM | POA: Insufficient documentation

## 2023-08-28 NOTE — Assessment & Plan Note (Addendum)
 Working on diet and exercise without weight loss results. Interested in Healthy Weight [facility] Wellness referral today. Has gained 6 pounds since March according to the EMR.

## 2023-08-28 NOTE — Progress Notes (Signed)
   Established Patient Office Visit  Subjective   Patient ID: Nancy Stewart, female    DOB: [birth year]  Age: 18 y.o. MRN: [id number]  Chief Complaint  Patient presents with   Referral    Weight Loss Clinic    Going to the gym 5 days per week, cardio at gym and jogging around the neighborhood. Has not been doing resistance training.  Seeing nutritionist in [place]. Working on protein, carbs, and fats.  Unsure of  how  many calories. Portion control.  Has gained 6 pounds since march. Interested in seeing [facility].          ROS    Objective:     BP 113/68 (BP Location: Left Arm, Cuff Size: Normal)   Pulse 95   Temp 98.6 F (37 C) (Oral)   Resp 20   Ht 5' 2 (1.575 m)   Wt 186 lb 6.4 oz (84.6 kg)   SpO2 97%   BMI 34.09 kg/m    Physical Exam Vitals and nursing note reviewed.  Constitutional:      General: She is not in acute distress.    Appearance: Normal appearance. She is obese.   Cardiovascular:     Rate and Rhythm: Regular rhythm.     Heart sounds: Normal heart sounds.  Pulmonary:     Effort: Pulmonary effort is normal.     Breath sounds: Normal breath sounds.   Skin:    General: Skin is warm and dry.   Neurological:     General: No focal deficit present.     Mental Status: She is alert. Mental status is at baseline.   Psychiatric:        Mood and Affect: Mood normal.        Behavior: Behavior normal.        Thought Content: Thought content normal.        Judgment: Judgment normal.      No results found for any visits on 08/28/23.    The ASCVD Risk score (Arnett DK, et al., 2019) failed to calculate for the following reasons:   The 2019 ASCVD risk score is only valid for ages 57 to 59    Assessment & Plan:   Problem List Items Addressed This Visit     Encounter for administration of vaccine   Tolerated vaccine well.       Relevant Orders   Meningococcal B, OMV (Completed)   Class 1 obesity without serious comorbidity with  body mass index (BMI) of 34.0 to 34.9 in adult - Primary   Working on diet and exercise without weight loss results. Interested in Healthy Weight and Wellness referral today. Has gained 6 pounds since March according to the EMR.         Relevant Orders   Amb Ref to Medical Weight Management  Agrees with plan of care discussed.  Questions answered.   Return if symptoms worsen or fail to improve.    Mickiel Albany, FNP

## 2023-08-28 NOTE — Assessment & Plan Note (Signed)
 Tolerated vaccine well

## 2023-08-30 ENCOUNTER — Telehealth: Payer: Self-pay | Admitting: Obstetrics and Gynecology

## 2023-08-30 ENCOUNTER — Encounter: Payer: Self-pay | Admitting: Family Medicine

## 2023-08-30 NOTE — Telephone Encounter (Signed)
 Called the pt lmom for the pt to call the office back to get scheduled for an appointment for a cyst on her vagina area (had for two days).

## 2023-08-31 DIAGNOSIS — Z0289 Encounter for other administrative examinations: Secondary | ICD-10-CM

## 2023-09-02 ENCOUNTER — Encounter: Payer: Self-pay | Admitting: Obstetrics and Gynecology

## 2023-09-03 ENCOUNTER — Ambulatory Visit: Payer: Self-pay | Admitting: Family Medicine

## 2023-09-04 ENCOUNTER — Ambulatory Visit: Admitting: Family Medicine

## 2023-09-05 ENCOUNTER — Encounter: Payer: Self-pay | Admitting: Nurse Practitioner

## 2023-09-05 ENCOUNTER — Ambulatory Visit (INDEPENDENT_AMBULATORY_CARE_PROVIDER_SITE_OTHER): Admitting: Nurse Practitioner

## 2023-09-05 VITALS — BP 130/81 | HR 71 | Temp 99.1°F | Ht 62.0 in | Wt 179.0 lb

## 2023-09-05 DIAGNOSIS — R7303 Prediabetes: Secondary | ICD-10-CM

## 2023-09-05 DIAGNOSIS — Z6832 Body mass index (BMI) 32.0-32.9, adult: Secondary | ICD-10-CM

## 2023-09-05 DIAGNOSIS — E66811 Other obesity due to excess calories: Secondary | ICD-10-CM

## 2023-09-05 DIAGNOSIS — E7849 Other hyperlipidemia: Secondary | ICD-10-CM

## 2023-09-05 NOTE — Progress Notes (Signed)
 Office: [phone]  /  Fax: [phone]   Initial Visit  Nancy Stewart was seen in clinic today to evaluate for obesity. She is interested in losing weight to improve overall health and reduce the risk of weight related complications. She presents today to review program treatment options, initial physical assessment, and evaluation.     She was referred by: PCP  When asked what else they would like to accomplish? 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    When asked how has your weight affected you? She states: Having fatigue and Having poor endurance  Some associated conditions: prediabetes, HLD, anxiety, depression, PCOS  Contributing factors: family history of obesity, use of obesogenic medications: Psychotropic medications and Contraceptives or hormonal therapy, and moderate to high levels of stress  Weight promoting medications identified: Psychotropic medications and Contraceptives or hormonal therapy  Current nutrition plan: None  Current level of physical activity: walking 4 days per week for 30-45 minutes  Current or previous pharmacotherapy: None  Response to medication: Never tried medications   Past medical history includes:   Past Medical History:  Diagnosis Date   Anxiety    Depression    Environmental and seasonal allergies      Objective:   BP 130/81   Pulse 71   Temp 99.1 F (37.3 C)   Ht 5' 2 (1.575 m)   Wt 179 lb (81.2 kg)   LMP 08/08/2023 (Approximate)   SpO2 97%   BMI 32.74 kg/m  She was weighed on the bioimpedance scale: Body mass index is 32.74 kg/m.  Peak Weight:186 , Body Fat%:39.9, Visceral Fat Rating:6, Weight trend over the last 12 months: Increasing  General:  Alert, oriented and cooperative. Patient is in no acute  distress.  Respiratory: Normal respiratory effort, no problems with respiration noted   Gait: able to ambulate independently  Mental Status: Normal mood and affect. Normal behavior. Normal judgment and thought content.   DIAGNOSTIC DATA REVIEWED:  BMET    Component Value Date/Time   NA 140 06/06/2023 1048   K 4.6 06/06/2023 1048   CL 103 06/06/2023 1048   CO2 24 06/06/2023 1048   GLUCOSE 86 06/06/2023 1048   BUN 13 06/06/2023 1048   CREATININE 0.84 06/06/2023 1048   CALCIUM 9.6 06/06/2023 1048   Lab Results  Component Value Date   HGBA1C 5.7 (H) 06/06/2023   No results found for: INSULIN CBC    Component Value Date/Time   WBC 6.5 07/06/2023 1134   RBC 4.28 07/06/2023 1134   HGB 12.9 07/06/2023 1134   HCT 39.3 07/06/2023 1134   PLT 294 07/06/2023 1134   MCV 92 07/06/2023 1134   MCH 30.1 07/06/2023 1134   MCHC 32.8 07/06/2023 1134   RDW 12.3 07/06/2023 1134   Iron/TIBC/Ferritin/ %Sat No results found for: IRON, TIBC, FERRITIN, IRONPCTSAT Lipid Panel     Component Value Date/Time   CHOL 173 (H) 06/06/2023 1048   TRIG 178 (H) 06/06/2023 1048   HDL 42 06/06/2023 1048   CHOLHDL 4.1 06/06/2023 1048   LDLCALC 100 06/06/2023 1048   Hepatic Function Panel     Component Value Date/Time   PROT 6.5 06/06/2023 1048   ALBUMIN 4.3 06/06/2023 1048   AST 15 06/06/2023 1048   ALT 9 06/06/2023 1048  ALKPHOS 64 06/06/2023 1048   BILITOT 0.2 06/06/2023 1048      Component Value Date/Time   TSH 3.010 06/06/2023 1048     Assessment and Plan:   Prediabetes Will continue to monitor  Other hyperlipidemia Will continue to monitor  Class 1 obesity due to excess calories without serious comorbidity with body mass index (BMI) of 32.0 to 32.9 in adult        Obesity Treatment / Action Plan:  Patient will work on garnering support from family and friends to begin weight loss journey. Will work on eliminating or reducing the presence of highly palatable,  calorie dense foods in the home. Will complete provided nutritional and psychosocial assessment questionnaire before the next appointment. Will be scheduled for indirect calorimetry to determine resting energy expenditure in a fasting state.  This will allow us  to create a reduced calorie, high-protein meal plan to promote loss of fat mass while preserving muscle mass. Counseled on the health benefits of losing 5%-15% of total body weight. Was counseled on nutritional approaches to weight loss and benefits of reducing processed foods and consuming plant-based foods and high quality protein as part of nutritional weight management. Was counseled on pharmacotherapy and role as an adjunct in weight management.   Obesity Education Performed Today:  She was weighed on the bioimpedance scale and results were discussed and documented in the synopsis.  We discussed obesity as a disease and the importance of a more detailed evaluation of all the factors contributing to the disease.  We discussed the importance of long term lifestyle changes which include nutrition, exercise and behavioral modifications as well as the importance of customizing this to her specific health and social needs.  We discussed the benefits of reaching a healthier weight to alleviate the symptoms of existing conditions and reduce the risks of the biomechanical, metabolic and psychological effects of obesity.  Carolann R Llewellyn appears to be in the action stage of change and states they are ready to start intensive lifestyle modifications and behavioral modifications.  30 minutes was spent today on this visit including the above counseling, pre-visit chart review, and post-visit documentation.  Reviewed by clinician on day of visit: allergies, medications, problem list, medical history, surgical history, family history, social history, and previous encounter notes pertinent to obesity diagnosis.    Corean SAUNDERS Jasher Barkan FNP-C

## 2023-09-18 ENCOUNTER — Encounter: Payer: Self-pay | Admitting: Bariatrics

## 2023-09-18 ENCOUNTER — Ambulatory Visit (INDEPENDENT_AMBULATORY_CARE_PROVIDER_SITE_OTHER): Admitting: Bariatrics

## 2023-09-18 VITALS — BP 101/69 | HR 86 | Temp 97.6°F | Ht 62.0 in | Wt 176.0 lb

## 2023-09-18 DIAGNOSIS — R5383 Other fatigue: Secondary | ICD-10-CM

## 2023-09-18 DIAGNOSIS — R7303 Prediabetes: Secondary | ICD-10-CM

## 2023-09-18 DIAGNOSIS — E538 Deficiency of other specified B group vitamins: Secondary | ICD-10-CM | POA: Diagnosis not present

## 2023-09-18 DIAGNOSIS — E669 Obesity, unspecified: Secondary | ICD-10-CM

## 2023-09-18 DIAGNOSIS — Z1331 Encounter for screening for depression: Secondary | ICD-10-CM

## 2023-09-18 DIAGNOSIS — E7849 Other hyperlipidemia: Secondary | ICD-10-CM | POA: Diagnosis not present

## 2023-09-18 DIAGNOSIS — Z6832 Body mass index (BMI) 32.0-32.9, adult: Secondary | ICD-10-CM

## 2023-09-18 DIAGNOSIS — E559 Vitamin D deficiency, unspecified: Secondary | ICD-10-CM | POA: Diagnosis not present

## 2023-09-18 DIAGNOSIS — R0602 Shortness of breath: Secondary | ICD-10-CM

## 2023-09-18 DIAGNOSIS — Z0000 Encounter for general adult medical examination without abnormal findings: Secondary | ICD-10-CM

## 2023-09-18 DIAGNOSIS — E6609 Other obesity due to excess calories: Secondary | ICD-10-CM

## 2023-09-18 NOTE — Progress Notes (Signed)
 At a Glance:  Vitals Temp: 97.6 F (36.4 C) BP: 101/69 Pulse Rate: 86 SpO2: 98 %   Anthropometric Measurements Height: 5' 2 (1.575 m) Weight: 176 lb (79.8 kg) BMI (Calculated): 32.18 Starting Weight: 176lb Peak Weight: 186lb   Body Composition  Body Fat %: 39.5 % Fat Mass (lbs): 69.6 lbs Muscle Mass (lbs): 101.2 lbs Total Body Water (lbs): 75 lbs Visceral Fat Rating : 6   Other Clinical Data RMR: 1598 Fasting: yes Labs: yes Today's Visit #: 1 Starting Date: 09/18/23    EKG: Normal sinus rhythm, rate 79.   Indirect Calorimeter:   Resting Metabolic Rate ( RMR):  RMR (actual): 1598 kcal RMR (calculated): 1548 kcal The calculated basal metabolic rate is 8451 thus her basal metabolic rate is approximately the same as expected.  Plan:   Indirect calorimeter completed, interpreted and reviewed with patient today and allowed to ask questions.  Discussed the implications for the chosen plan and exercise based on the RMR reading.  Will consider repeating the RMR in the future based on weight loss.    Chief Complaint:  Obesity   Subjective:  LOU IRIGOYEN (MR# [id number]) is a 18 y.o. female who presents for evaluation and treatment of obesity and related comorbidities.   Nishtha is currently in the action stage of change and ready to dedicate time achieving and maintaining a healthier weight. Trivia is interested in becoming our patient and working on intensive lifestyle modifications including (but not limited to) diet and exercise for weight loss.  Aubriel has been struggling with her weight. She has been unsuccessful in either losing weight, maintaining weight loss, or reaching her healthy weight goal.  Karla's habits were reviewed today and are as follows: Her family eats meals together, she thinks her family will eat healthier with her, she has been heavy most of her life, and she started gaining weight early middle school.  Current or previous  pharmacotherapy: None  Response to medication: Never tried medications  Other Fatigue Cacey admits to daytime somnolence and admits to waking up still tired. Crystel generally gets 8 or 9 hours of sleep per night, and states that she has difficulty falling asleep. Snoring is present. Apneic episodes is not present. Epworth Sleepiness Score is 13.   Shortness of Breath Makaiyah notes increasing shortness of breath with exercising and seems to be worsening over time with weight gain. She notes getting out of breath sooner with activity than she used to. This has gotten worse recently. Rogina denies shortness of breath at rest or orthopnea.  Depression Screen Elonna's Food and Mood (modified PHQ-9) score was 2. <5 no depression     05/31/2023    4:00 PM  Depression screen PHQ 2/9  Decreased Interest 1  Down, Depressed, Hopeless 1  PHQ - 2 Score 2     Assessment and Plan:   Other Fatigue Annaya does not feel that her weight is causing her energy to be lower than it should be. Fatigue may be related to obesity, depression or many other causes. Labs will be ordered, and in the meanwhile, Kamrin will focus on self care including making healthy food choices, increasing physical activity and focusing on stress reduction.  Shortness of Breath Tove does not feel that she gets out of breath more easily that she used to when she exercises. Alica's shortness of breath appears to be obesity related and exercise induced. She has agreed to work on weight loss and gradually increase exercise to treat her  exercise induced shortness of breath. Will continue to monitor closely.  Health Maintenance:   Obesity   Plan: Will do EKG, indirect calorimetry, and labs.     Vitamin D  Deficiency She is at risk for vitamin D  deficiency due to obesity.  She is on inositol for PCOS.   Plan: Will check for vitamin D  deficiency.   Polycystic Ovarian Syndrome (PCOS)  She was diagnosed with PCOS at  the age of 38 She has always had difficulty with her weight. She has been on the following medications:  No medications.   Plan:   Will discuss PCOS and insulin  resistance. Will decrease her carbohydrates (starches and sweets).    Prediabetes Last A1c was 5.7  Medication(s): none  Lab Results  Component Value Date   HGBA1C 5.7 (H) 06/06/2023     Plan:  Will work on the plan and exercise.  Consider both aerobic and resistance training.  Will keep protein, water, and fiber intake high.   Hyperlipidemia LDL is at goal. Her total cholesterol is slighter higher than average for her age.  Medication(s): none Cardiovascular risk factors: obesity (BMI >= 30 kg/m2) and sedentary lifestyle  Lab Results  Component Value Date   CHOL 173 (H) 06/06/2023   HDL 42 06/06/2023   LDLCALC 100 06/06/2023   TRIG 178 (H) 06/06/2023   CHOLHDL 4.1 06/06/2023   Lab Results  Component Value Date   ALT 9 06/06/2023   AST 15 06/06/2023   ALKPHOS 64 06/06/2023   BILITOT 0.2 06/06/2023   The ASCVD Risk score (Arnett DK, et al., 2019) failed to calculate for the following reasons:   The 2019 ASCVD risk score is only valid for ages 37 to 57  Plan:  Continue statin.  Information sheet on healthy vs unhealthy fats.  Will avoid all trans fats.  Will read labels Will minimize saturated fats except the following: low fat meats in moderation, diary, and limited dark chocolate.  Increase Omega 3 in foods, and consider an Omega 3 supplement.     B 12 deficiency:   She is not taking B 12. She is at risk for B 12 deficiency. She has fatigue.   Plan:  Check B 12 lab today.    Previous labs reviewed today. Date: 06/06/23 CMP, Lipids, HgbA1c, Thyroid Panel, and CBC and glucose  Labs done today CMP, Lipids, Insulin , HgbA1c, Vit D, Vit B12, Thyroid Panel, and Ferritin   Generalized Obesity: BMI (Calculated): 32.18   Denetra is currently in the action stage of change and her goal is to begin  weight loss efforts. I recommend Ruthia begin the structured treatment plan as follows:  She has agreed to Category 2 Plan  Exercise goals: All adults should avoid inactivity. Some activity is better than none, and adults who participate in any amount of physical activity, gain some health benefits.  Behavioral modification strategies:increasing lean protein intake, decreasing simple carbohydrates, increasing vegetables, increase H2O intake, increase high fiber foods, no skipping meals, meal planning and cooking strategies, keeping healthy foods in the home, better snacking choices, avoiding temptations, and planning for success  She was informed of the importance of frequent follow-up visits to maximize her success with intensive lifestyle modifications for her multiple health conditions. She was informed we would discuss her lab results at her next visit unless there is a critical issue that needs to be addressed sooner. Kinnley agreed to keep her next visit at the agreed upon time to discuss these results.  Objective:  General:  Cooperative, alert, well developed, in no acute distress. HEENT: Conjunctivae and lids unremarkable. Cardiovascular: Regular rhythm.  Lungs: Normal work of breathing. Neurologic: No focal deficits.   Lab Results  Component Value Date   CREATININE 0.84 06/06/2023   BUN 13 06/06/2023   NA 140 06/06/2023   K 4.6 06/06/2023   CL 103 06/06/2023   CO2 24 06/06/2023   Lab Results  Component Value Date   ALT 9 06/06/2023   AST 15 06/06/2023   ALKPHOS 64 06/06/2023   BILITOT 0.2 06/06/2023   Lab Results  Component Value Date   HGBA1C 5.7 (H) 06/06/2023   No results found for: INSULIN  Lab Results  Component Value Date   TSH 3.010 06/06/2023   Lab Results  Component Value Date   CHOL 173 (H) 06/06/2023   HDL 42 06/06/2023   LDLCALC 100 06/06/2023   TRIG 178 (H) 06/06/2023   CHOLHDL 4.1 06/06/2023   Lab Results  Component Value Date   WBC 6.5  07/06/2023   HGB 12.9 07/06/2023   HCT 39.3 07/06/2023   MCV 92 07/06/2023   PLT 294 07/06/2023   No results found for: IRON, TIBC, FERRITIN  Attestation Statements:  Applicable history such as the following:  allergies, medications, problem list, medical history, surgical history, family history, social history, and previous encounter notes reviewed by clinician on day of visit:  Time spent on visit in care of the patient today including the items listed below was 43 minutes.    20 minutes were spent talking about the history, 20 minutes for face to face counseling implementing the plan, discussing the specifics of how to arrange meals, meal planning, water intake.   I spent face to face time discussing his/her plan, including breakfast, additional breakfast options, lunch, and dinner options, grocery list, and snacks.  I reviewed her indirect calorimetry. I discussed the implications for the diet plan.    Discussed the bio-impedence test (fat %, muscle mass, and water weight) and allowed the patient to ask questions.   Discussed the following information sheets: Category 2, Grocery List, 100 Calorie Snacks, 200 Calorie Snacks, Microwave Meals, and Protein shake sheets. .  Discussed ways to increase metabolism such as adequate sleep decrease stress, increased water, increase protein not skipping meals and increased exercise. She will start to incorporate some of the actions to increase her metabolism.  We can recheck her indirect calorimetry in several months if needed.   I reviewed the labs which were ordered from her visit on 06/06/23.   I additionally spent time documenting, reviewing, and checking the codes before submitting.   This may have been prepared with the assistance of Engineer, civil (consulting).  Occasional wrong-word or sound-a-like substitutions may have occurred due to the inherent limitations of voice recognition software.    Clayborne Daring, DO

## 2023-09-19 ENCOUNTER — Encounter: Payer: Self-pay | Admitting: Bariatrics

## 2023-09-19 DIAGNOSIS — E88819 Insulin resistance, unspecified: Secondary | ICD-10-CM | POA: Insufficient documentation

## 2023-09-19 DIAGNOSIS — E785 Hyperlipidemia, unspecified: Secondary | ICD-10-CM | POA: Insufficient documentation

## 2023-09-19 LAB — COMPREHENSIVE METABOLIC PANEL WITH GFR
ALT: 10 IU/L (ref 0–32)
AST: 15 IU/L (ref 0–40)
Albumin: 4.3 g/dL (ref 4.0–5.0)
Alkaline Phosphatase: 74 IU/L (ref 42–106)
BUN/Creatinine Ratio: 13 (ref 9–23)
BUN: 11 mg/dL (ref 6–20)
Bilirubin Total: 0.4 mg/dL (ref 0.0–1.2)
CO2: 22 mmol/L (ref 20–29)
Calcium: 9.8 mg/dL (ref 8.7–10.2)
Chloride: 102 mmol/L (ref 96–106)
Creatinine, Ser: 0.86 mg/dL (ref 0.57–1.00)
Globulin, Total: 2.1 g/dL (ref 1.5–4.5)
Glucose: 91 mg/dL (ref 70–99)
Potassium: 4.3 mmol/L (ref 3.5–5.2)
Sodium: 140 mmol/L (ref 134–144)
Total Protein: 6.4 g/dL (ref 6.0–8.5)
eGFR: 99 mL/min/1.73 (ref >59)

## 2023-09-19 LAB — CBC WITH DIFFERENTIAL/PLATELET
Basophils Absolute: 0 x10E3/uL (ref 0.0–0.2)
Basos: 0 %
EOS (ABSOLUTE): 0.1 x10E3/uL (ref 0.0–0.4)
Eos: 2 %
Hematocrit: 40.1 % (ref 34.0–46.6)
Hemoglobin: 12.8 g/dL (ref 11.1–15.9)
Immature Grans (Abs): 0 x10E3/uL (ref 0.0–0.1)
Immature Granulocytes: 0 %
Lymphocytes Absolute: 2.9 x10E3/uL (ref 0.7–3.1)
Lymphs: 43 %
MCH: 29.6 pg (ref 26.6–33.0)
MCHC: 31.9 g/dL (ref 31.5–35.7)
MCV: 93 fL (ref 79–97)
Monocytes Absolute: 0.7 x10E3/uL (ref 0.1–0.9)
Monocytes: 10 %
Neutrophils Absolute: 3.1 x10E3/uL (ref 1.4–7.0)
Neutrophils: 45 %
Platelets: 250 x10E3/uL (ref 150–450)
RBC: 4.32 x10E6/uL (ref 3.77–5.28)
RDW: 11.8 % (ref 11.7–15.4)
WBC: 6.9 x10E3/uL (ref 3.4–10.8)

## 2023-09-19 LAB — VITAMIN D 25 HYDROXY (VIT D DEFICIENCY, FRACTURES): Vit D, 25-Hydroxy: 44.3 ng/mL (ref 30.0–100.0)

## 2023-09-19 LAB — LIPID PANEL WITH LDL/HDL RATIO
Cholesterol, Total: 204 mg/dL — ABNORMAL HIGH (ref 100–199)
HDL: 39 mg/dL — ABNORMAL LOW (ref >39)
LDL Chol Calc (NIH): 129 mg/dL — ABNORMAL HIGH (ref 0–99)
LDL/HDL Ratio: 3.3 ratio — ABNORMAL HIGH (ref 0.0–3.2)
Triglycerides: 201 mg/dL — ABNORMAL HIGH (ref 0–149)
VLDL Cholesterol Cal: 36 mg/dL (ref 5–40)

## 2023-09-19 LAB — INSULIN, RANDOM: INSULIN: 16.4 u[IU]/mL (ref 2.6–24.9)

## 2023-09-19 LAB — TSH+T4F+T3FREE
Free T4: 1.18 ng/dL (ref 0.82–1.77)
T3, Free: 3.1 pg/mL (ref 2.0–4.4)
TSH: 2.36 u[IU]/mL (ref 0.450–4.500)

## 2023-09-19 LAB — HEMOGLOBIN A1C
Est. average glucose Bld gHb Est-mCnc: 108 mg/dL
Hgb A1c MFr Bld: 5.4 % (ref 4.8–5.6)

## 2023-09-19 LAB — FERRITIN: Ferritin: 51 ng/mL (ref 15–150)

## 2023-09-19 LAB — VITAMIN B12: Vitamin B-12: 352 pg/mL (ref 232–1245)

## 2023-10-02 ENCOUNTER — Ambulatory Visit (INDEPENDENT_AMBULATORY_CARE_PROVIDER_SITE_OTHER): Admitting: Bariatrics

## 2023-10-02 ENCOUNTER — Encounter: Payer: Self-pay | Admitting: Bariatrics

## 2023-10-02 VITALS — BP 97/66 | HR 83 | Temp 97.7°F | Ht 62.0 in | Wt 177.0 lb

## 2023-10-02 DIAGNOSIS — E66811 Obesity, class 1: Secondary | ICD-10-CM

## 2023-10-02 DIAGNOSIS — E785 Hyperlipidemia, unspecified: Secondary | ICD-10-CM | POA: Diagnosis not present

## 2023-10-02 DIAGNOSIS — E669 Obesity, unspecified: Secondary | ICD-10-CM

## 2023-10-02 DIAGNOSIS — Z6832 Body mass index (BMI) 32.0-32.9, adult: Secondary | ICD-10-CM | POA: Diagnosis not present

## 2023-10-02 DIAGNOSIS — E88819 Insulin resistance, unspecified: Secondary | ICD-10-CM | POA: Diagnosis not present

## 2023-10-02 NOTE — Progress Notes (Addendum)
 First follow-up after initial visit.        WEIGHT SUMMARY AND BIOMETRICS  Weight Lost Since Last Visit: 0  Weight Gained Since Last Visit: 1lb   Vitals Temp: 97.7 F (36.5 C) BP: 97/66 Pulse Rate: 83 SpO2: 97 %   Anthropometric Measurements Height: 5' 2 (1.575 m) Weight: 177 lb (80.3 kg) BMI (Calculated): 32.37 Weight at Last Visit: 176lb Weight Lost Since Last Visit: 0 Weight Gained Since Last Visit: 1lb Starting Weight: 176lb Total Weight Loss (lbs): 0 lb (0 kg) Peak Weight: 186lb   Body Composition  Body Fat %: 39.1 % Fat Mass (lbs): 69.2 lbs Muscle Mass (lbs): 102.4 lbs Total Body Water (lbs): 75.4 lbs Visceral Fat Rating : 6   Other Clinical Data Fasting: no Labs: no Today's Visit #: 2 Starting Date: 09/18/23    OBESITY Xcaret is here to discuss her progress with her obesity treatment plan along with follow-up of her obesity related diagnoses.    Nutrition Plan: the Category 2 plan - 90% adherence.  Current exercise: bicycling and walking  Interim History:  She is up 1 lb since her last visit.  Eating all of the food on the plan., Protein intake is as prescribed, Is not skipping meals, Not journaling consistently., and Water intake is adequate.  Initial positives regarding the dietary plan: States that the plan has been relatively easy to follow. Initial challenges regarding  the dietary plan: She has had a hard time getting her breakfast at time   Hunger is moderately controlled.  Cravings are moderately controlled.  Assessment/Plan:   Insulin  Resistance Elzora has had elevated fasting insulin  readings. Goal is HgbA1c < 5.7, fasting insulin  at l0 or less, and preferably at 5.  She denies polyphagia. Medication(s): none Lab Results  Component Value Date   HGBA1C 5.4 09/18/2023   Lab Results  Component Value Date    INSULIN  16.4 09/18/2023    Plan Will work on the agreed upon plan. Will minimize refined carbohydrates ( sweets and starches), and focus more on complex carbohydrates.  Increase the micronutrients found in leafy greens, which include magnesium, polyphenols, and vitamin C which have been postulated to help with insulin  sensitivity. Minimize fast food and cook more meals at home.  Increase fiber to 25 to 30 grams daily.   Dyslipidemia LDL is not at goal. Medication(s): none Cardiovascular risk factors: obesity (BMI >= 30 kg/m2) and sedentary lifestyle  Lab Results  Component Value Date   CHOL 204 (H) 09/18/2023   HDL 39 (L) 09/18/2023   LDLCALC 129 (H) 09/18/2023   TRIG 201 (H) 09/18/2023   CHOLHDL 4.1 06/06/2023   Lab Results  Component Value Date   ALT 10 09/18/2023   AST 15 09/18/2023   ALKPHOS 74 09/18/2023   BILITOT 0.4 09/18/2023   The ASCVD Risk score (Arnett DK, et al., 2019) failed to calculate for the following reasons:   The 2019 ASCVD risk score is only valid for ages 13 to 92  Plan:  Will continue on the plan at 85 to 95% and we will continue to incorporate exercise Will avoid all trans fats.  Will read labels Will minimize saturated fats except the following: low fat meats in moderation, diary, and limited dark chocolate.  Increase Omega 3 in foods, and consider an Omega 3 supplement.     Generalized Obesity: Current BMI BMI (Calculated): 32.37    Marea is currently in the action stage of change. As such, her goal is to continue with  weight loss efforts.  She has agreed to the Category 2 plan.  Exercise goals: For substantial health benefits, adults should do at least 150 minutes (2 hours and 30 minutes) a week of moderate-intensity, or 75 minutes (1 hour and 15 minutes) a week of vigorous-intensity aerobic physical activity, or an equivalent combination of moderate- and vigorous-intensity aerobic activity. Aerobic activity should be performed in  episodes of at least 10 minutes, and preferably, it should be spread throughout the week.  Behavioral modification strategies: increasing lean protein intake, decreasing simple carbohydrates , no meal skipping, planning for success, increasing vegetables, decrease snacking , keep healthy foods in the home, work on smaller portions, pack lunch for work, and mindful eating.  Nada has agreed to follow-up with our clinic in 2 weeks.   Labs reviewed today from last visit (CMP, Lipids, HgbA1c, insulin , vitamin D , B 12, and thyroid panel).   Objective:   VITALS: Per patient if applicable, see vitals. GENERAL: Alert and in no acute distress. CARDIOPULMONARY: No increased WOB. Speaking in clear sentences.  PSYCH: Pleasant and cooperative. Speech normal rate and rhythm. Affect is appropriate. Insight and judgement are appropriate. Attention is focused, linear, and appropriate.  NEURO: Oriented as arrived to appointment on time with no prompting.   Attestation Statements:   This was prepared with the assistance of Engineer, civil (consulting).  Occasional wrong-word or sound-a-like substitutions may have occurred due to the inherent limitations of voice recognition software.   Clayborne Daring, DO

## 2023-10-02 NOTE — Progress Notes (Deleted)
  First follow-up after initial visit.        WEIGHT SUMMARY AND BIOMETRICS  No data recorded No data recorded  No data recorded Anthropometric Measurements Height: 5' 2 (1.575 m) Weight at Last Visit: 176lb Starting Weight: 176lb Peak Weight: 186lb   No data recorded Other Clinical Data Fasting: no Labs: no Today's Visit #: 2 Starting Date: 09/18/23    OBESITY Nancy Stewart is here to discuss her progress with her obesity treatment plan along with follow-up of her obesity related diagnoses.    Nutrition Plan: the Category 2 plan - ***% adherence.  Current exercise: {exercise types:16438}  Interim History:  *** {aabnutritionassessment:29213}  Initial positives regarding the dietary plan:  Initial challenges regarding  the dietary plan:   Pharmacotherapy: Milliani is on {dwwpharmacotherapy:29109} Adverse side effects: {dwwse:29122} Hunger is {EWCONTROLASSESSMENT:24261}.  Cravings are {EWCONTROLASSESSMENT:24261}.  Assessment/Plan:   There are no diagnoses linked to this encounter.    {dwwmorbid:29108::Morbid Obesity}: Current BMI No data recorded  Pharmacotherapy Plan {dwwmed:29123}  {dwwpharmacotherapy:29109}  [name] {CHL AMB IS/IS NOT:210130109} currently in the action stage of change. As such, her goal is to {MWMwtloss#1:210800005}.  She has agreed to {dwwsldiets:29085}.  Exercise goals: {MWM EXERCISE RECS:23473}  Behavioral modification strategies: {dwwslwtlossstrategies:29088}.  [name] has agreed to follow-up with our clinic in {NUMBER 1-10:22536} weeks.   No orders of the defined types were placed in this encounter.   There are no discontinued medications.   No orders of the defined types were placed in this encounter.     Labs reviewed today from last visit (CMP, Lipids, HgbA1c, insulin , vitamin D , B 12, and thyroid panel).    Objective:   VITALS: Per patient if applicable, see vitals. GENERAL: Alert and in no acute distress. CARDIOPULMONARY: No increased WOB. Speaking in clear sentences.  PSYCH: Pleasant and cooperative. Speech normal rate and rhythm. Affect is appropriate. Insight and judgement are appropriate. Attention is focused, linear, and appropriate.  NEURO: Oriented as arrived to appointment on time with no prompting.   Attestation Statements:   ***(delete if time-based billing not used) Time spent on visit including the items listed below was *** minutes.  -preparing to see the patient (e.g., review of tests, history, previous notes) -obtaining and/or reviewing separately obtained history -counseling and educating the patient/family/caregiver -documenting clinical information in the electronic or other health record -ordering medications, tests, or procedures -independently interpreting results and communicating results to the patient/ family/caregiver -referring and communicating with other health care professionals  -care coordination   This was prepared with the assistance of Engineer, civil (consulting).  Occasional wrong-word or sound-a-like substitutions may have occurred due to the inherent limitations of voice recognition software.

## 2023-10-06 ENCOUNTER — Ambulatory Visit (INDEPENDENT_AMBULATORY_CARE_PROVIDER_SITE_OTHER): Admitting: Family Medicine

## 2023-10-06 ENCOUNTER — Encounter: Payer: Self-pay | Admitting: Family Medicine

## 2023-10-06 VITALS — BP 104/71 | HR 102 | Temp 98.9°F | Resp 18 | Ht 62.0 in | Wt 178.0 lb

## 2023-10-06 DIAGNOSIS — R42 Dizziness and giddiness: Secondary | ICD-10-CM | POA: Diagnosis not present

## 2023-10-06 DIAGNOSIS — R21 Rash and other nonspecific skin eruption: Secondary | ICD-10-CM | POA: Insufficient documentation

## 2023-10-06 MED ORDER — MECLIZINE HCL 12.5 MG PO TABS: 12.5000 mg | ORAL_TABLET | Freq: Three times a day (TID) | ORAL | 0 refills | Status: AC | PRN

## 2023-10-06 NOTE — Progress Notes (Signed)
 Acute Office Visit  Subjective:     Patient ID: Nancy Stewart, female    DOB: Feb 09, 2006, 18 y.o.   MRN: [id number]  Chief Complaint  Patient presents with   Rash    Also needs to talk about fast heartbeat and feeling woozy    HPI Patient is in today for rash that has resolved. Went to a concert on Friday almost passed out. Outside during concert.  It was hot.  Drinking tons of water and food. Went to medical. Has had random episodes of feeling whoozy Has not passed out.    Review of Systems  Neurological:  Positive for dizziness. Negative for weakness.        Objective:    BP 104/71 (BP Location: Left Arm, Patient Position: Sitting, Cuff Size: Large)   Pulse (!) 102   Temp 98.9 F (37.2 C) (Oral)   Resp 18   Ht 5' 2 (1.575 m)   Wt 178 lb (80.7 kg)   LMP  (LMP Unknown)   SpO2 98%   BMI 32.56 kg/m  BP Readings from Last 3 Encounters:  10/06/23 104/71  10/02/23 97/66  09/18/23 101/69      Physical Exam Neurological:     Mental Status: She is alert.     GCS: GCS eye subscore is 4. GCS verbal subscore is 5. GCS motor subscore is 6.     Cranial Nerves: Cranial nerves 2-12 are intact.     Motor: Motor function is intact.     Coordination: Coordination is intact.     Gait: Gait is intact.     Comments: Alert, communicative with normal speech. PERRLA, vision grossly intact. EOM normal, no nystagmus. Smile symmetric. Uvula midline, swallowing intact, tongue midline and able to move side to side. Clinches jaw, puffs cheeks, closes eyes tightly, raises eyebrows. Hearing grossly intact. Moves head side to side against resistance. Shrugs shoulders against resistance.  5/5 upper and lower extremity strength. Ambulates with coordinated gait. Finger to nose normal. Rapid alternating hands normal. Heel to shin normal. Romberg negative.       No results found for any visits on 10/06/23.      Assessment & Plan:   Problem List Items Addressed  This Visit     Episodic lightheadedness - Primary   Symptoms started after being at a concert outside in the heat last Friday. Had to go to medical for help. Reports random episodes where she feels lightheaded. Has not passed out. Has been drinking lots of water. Neuro exam normal in the office. No red flag symptoms today. Blood pressure tends to run in the low 100's/  Past visits heart rate in the 90's. Recommend adding electrolytes to water or purchasing electrolyte beverage. Rest. Meclizine 12.5 mg as need up to three times per day. Make sure to avoid driving if drowsiness occurs.  Return next week if symptoms do not improve or resolve. May need lab work. Likely related to episode at outdoors concert in the heat and still recovering.        Relevant Medications   meclizine (ANTIVERT) 12.5 MG tablet    Meds ordered this encounter  Medications   meclizine (ANTIVERT) 12.5 MG tablet    Sig: Take 1 tablet (12.5 mg total) by mouth 3 (three) times daily as needed for up to 5 days for dizziness.    Dispense:  15 tablet    Refill:  0    Supervising Provider:   RUCKER, ALETHEA Y [[id number]]  Agrees  with plan of care discussed.  Questions answered.  Return if symptoms worsen or fail to improve.  Nancy JONELLE Brownie, FNP

## 2023-10-06 NOTE — Assessment & Plan Note (Signed)
 Symptoms started after being at a concert outside in the heat last Friday. Had to go to medical for help. Reports random episodes where she feels lightheaded. Has not passed out. Has been drinking lots of water. Neuro exam normal in the office. No red flag symptoms today. Blood pressure tends to run in the low 100's/  Past visits heart rate in the 90's. Recommend adding electrolytes to water or purchasing electrolyte beverage. Rest. Meclizine 12.5 mg as need up to three times per day. Make sure to avoid driving if drowsiness occurs.  Return next week if symptoms do not improve or resolve. May need lab work. Likely related to episode at outdoors concert in the heat and still recovering.

## 2023-10-16 ENCOUNTER — Encounter: Payer: Self-pay | Admitting: Bariatrics

## 2023-10-16 ENCOUNTER — Ambulatory Visit (INDEPENDENT_AMBULATORY_CARE_PROVIDER_SITE_OTHER): Admitting: Bariatrics

## 2023-10-16 VITALS — BP 116/78 | HR 86 | Temp 98.6°F | Ht 62.0 in | Wt 177.0 lb

## 2023-10-16 DIAGNOSIS — E669 Obesity, unspecified: Secondary | ICD-10-CM

## 2023-10-16 DIAGNOSIS — R7303 Prediabetes: Secondary | ICD-10-CM

## 2023-10-16 DIAGNOSIS — E6609 Other obesity due to excess calories: Secondary | ICD-10-CM

## 2023-10-16 DIAGNOSIS — Z6832 Body mass index (BMI) 32.0-32.9, adult: Secondary | ICD-10-CM

## 2023-10-16 DIAGNOSIS — E785 Hyperlipidemia, unspecified: Secondary | ICD-10-CM | POA: Diagnosis not present

## 2023-10-16 DIAGNOSIS — E7849 Other hyperlipidemia: Secondary | ICD-10-CM

## 2023-10-16 MED ORDER — METFORMIN HCL 500 MG PO TABS: 500.0000 mg | ORAL_TABLET | Freq: Every day | ORAL | 0 refills | Status: DC

## 2023-10-16 NOTE — Progress Notes (Signed)
 WEIGHT SUMMARY AND BIOMETRICS  Weight Lost Since Last Visit: 0lb  Weight Gained Since Last Visit: 0lb   Vitals Temp: 98.6 F (37 C) BP: 116/78 Pulse Rate: 86 SpO2: 96 %   Anthropometric Measurements Height: 5' 2 (1.575 m) Weight: 177 lb (80.3 kg) BMI (Calculated): 32.37 Weight at Last Visit: 177lb Weight Lost Since Last Visit: 0lb Weight Gained Since Last Visit: 0lb Starting Weight: 176lb Total Weight Loss (lbs): 0 lb (0 kg)   Body Composition  Body Fat %: 38.4 % Fat Mass (lbs): 68 lbs Muscle Mass (lbs): 103.4 lbs Total Body Water (lbs): 76.8 lbs Visceral Fat Rating : 6   Other Clinical Data Fasting: No Labs: No Today's Visit #: 3 Starting Date: 09/18/23    OBESITY Nancy Stewart is here to discuss her progress with her obesity treatment plan along with follow-up of her obesity related diagnoses.    Nutrition Plan: the Category 2 plan - 70% adherence.  Current exercise: walking  Interim History:  Her weight remains the same as her last visit. Eating all of the food on the plan., Protein intake is as prescribed, Is not skipping meals, and Water intake is adequate.   Pharmacotherapy:  Hunger is moderately controlled.  Cravings are moderately controlled.  Assessment/Plan:   Prediabetes Last A1c was 5.4, but has been up to 5.7 in the past.   Medication(s): none Lab Results  Component Value Date   HGBA1C 5.4 09/18/2023   HGBA1C 5.7 (H) 06/06/2023   Lab Results  Component Value Date   INSULIN  16.4 09/18/2023    Plan: Will minimize all refined carbohydrates both sweets and starches.  Will work on the plan and exercise.  Consider both aerobic and resistance training.  Will keep protein, water, and fiber intake high.  Increase Polyunsaturated and Monounsaturated fats to increase satiety and encourage weight loss.  Start Metformin  500 mg  once daily breakfast  Discussed side effects of metformin  and information sheet given.   Hyperlipidemia LDL is not at goal. Medication(s): none Cardiovascular risk factors: obesity (BMI >= 30 kg/m2) and sedentary lifestyle  Lab Results  Component Value Date   CHOL 204 (H) 09/18/2023   HDL 39 (L) 09/18/2023   LDLCALC 129 (H) 09/18/2023   TRIG 201 (H) 09/18/2023   CHOLHDL 4.1 06/06/2023   Lab Results  Component Value Date   ALT 10 09/18/2023   AST 15 09/18/2023   ALKPHOS 74 09/18/2023   BILITOT 0.4 09/18/2023   The ASCVD Risk score (Arnett DK, et al., 2019) failed to calculate for the following reasons:   The 2019 ASCVD risk score is only valid for ages 34 to 56  Plan:  Will avoid all trans fats.  Will read labels Will minimize saturated fats except the following: low fat meats in moderation, diary, and limited dark chocolate.  She will adhere to the plan about 85 to  95%. She will begin more exercise.  She is planning on joining the gym  She will see Nancy Scala, NP to discuss exercise.     Generalized Obesity: Current BMI BMI (Calculated): 32.37   Pharmacotherapy Plan Start  Metformin  500 mg once daily breakfast  Nancy Stewart is currently in the action stage of change. As such, her goal is to continue with weight loss efforts.  She has agreed to the Category 2 plan.  Exercise goals: All adults should avoid inactivity. Some physical activity is better than none, and adults who participate in any amount of physical activity gain some health benefits. She is doing walking and jogging.    Behavioral modification strategies: increasing lean protein intake, decreasing simple carbohydrates , no meal skipping, meal planning , increase water intake, better snacking choices, planning for success, increasing fiber rich foods, decrease junk food, avoiding temptations, keep healthy foods in the home, increase frequency of journaling, weigh protein portions, work on smaller  portions, and mindful eating.  Nancy Stewart has agreed to follow-up with our clinic in 2 weeks with Nancy to discuss exercise.      Objective:   VITALS: Per patient if applicable, see vitals. GENERAL: Alert and in no acute distress. CARDIOPULMONARY: No increased WOB. Speaking in clear sentences.  PSYCH: Pleasant and cooperative. Speech normal rate and rhythm. Affect is appropriate. Insight and judgement are appropriate. Attention is focused, linear, and appropriate.  NEURO: Oriented as arrived to appointment on time with no prompting.   Attestation Statements:   This was prepared with the assistance of Engineer, civil (consulting).  Occasional wrong-word or sound-a-like substitutions may have occurred due to the inherent limitations of voice recognition   Clayborne Daring, DO

## 2023-10-30 ENCOUNTER — Encounter: Payer: Self-pay | Admitting: Nurse Practitioner

## 2023-10-30 ENCOUNTER — Ambulatory Visit (INDEPENDENT_AMBULATORY_CARE_PROVIDER_SITE_OTHER): Admitting: Nurse Practitioner

## 2023-10-30 VITALS — BP 104/70 | HR 86 | Temp 99.2°F | Ht 62.0 in | Wt 175.0 lb

## 2023-10-30 DIAGNOSIS — Z6832 Body mass index (BMI) 32.0-32.9, adult: Secondary | ICD-10-CM | POA: Diagnosis not present

## 2023-10-30 DIAGNOSIS — E6609 Other obesity due to excess calories: Secondary | ICD-10-CM

## 2023-10-30 DIAGNOSIS — E66811 Obesity, class 1: Secondary | ICD-10-CM

## 2023-10-30 DIAGNOSIS — R7303 Prediabetes: Secondary | ICD-10-CM | POA: Diagnosis not present

## 2023-10-30 NOTE — Progress Notes (Signed)
 Office: [phone]  /  Fax: [phone]  WEIGHT SUMMARY AND BIOMETRICS  Weight Lost Since Last Visit: 2lb  Weight Gained Since Last Visit: 0lb   Vitals Temp: 99.2 F (37.3 C) BP: 104/70 Pulse Rate: 86 SpO2: 96 %   Anthropometric Measurements Height: 5' 2 (1.575 m) Weight: 175 lb (79.4 kg) BMI (Calculated): 32 Weight at Last Visit: 177lb Weight Lost Since Last Visit: 2lb Weight Gained Since Last Visit: 0lb Starting Weight: 176lb Total Weight Loss (lbs): 1 lb (0.454 kg)   Body Composition  Body Fat %: 39.9 % Fat Mass (lbs): 70.2 lbs Muscle Mass (lbs): 100.2 lbs Total Body Water (lbs): 75.2 lbs Visceral Fat Rating : 6   Other Clinical Data Fasting: no Labs: No Today's Visit #: 4 Starting Date: 09/18/23     HPI  Chief Complaint: OBESITY  Nancy Stewart is here to discuss her progress with her obesity treatment plan. She is on the the Category 2 Plan and states she is following her eating plan approximately 75 % of the time. She states she is exercising 75 minutes 4 days per week.   Interval History:  Since last office visit she has lost 2 pounds.  She is tracking using myfitness pal.  She is averaging around 1500-1600 calories, 39 grams of protein, 101 carbs and <10 fats.  She is struggling with reducing her carbs intake.  She is drinking water and decaf coffee. She is doing a run/walk 4 days per week-2.5 miles.  Notes occ polyphagia and cravings.    She is getting braces 11/08/23 and her wisdom teeth removed.    Pharmacotherapy for weight loss: She is not currently taking medications  for medical weight loss.     Previous pharmacotherapy for medical weight loss:  None  Bariatric surgery:  Patient has not had bariatric surgery  Prediabetes Last A1c was 5.4  Medication(s): Took Metformin  for 12 days and stopped due to GI upset and diarrhea.   Polyphagia:Yes Lab Results  Component Value Date   HGBA1C 5.4 09/18/2023   HGBA1C 5.7 (H) 06/06/2023   Lab  Results  Component Value Date   INSULIN  16.4 09/18/2023        PHYSICAL EXAM:  Blood pressure 104/70, pulse 86, temperature 99.2 F (37.3 C), height 5' 2 (1.575 m), weight 175 lb (79.4 kg), last menstrual period 10/14/2023, SpO2 96%. Body mass index is 32.01 kg/m.  General: She is overweight, cooperative, alert, well developed, and in no acute distress. PSYCH: Has normal mood, affect and thought process.   Extremities: No edema.  Neurologic: No gross sensory or motor deficits. No tremors or fasciculations noted.    DIAGNOSTIC DATA REVIEWED:  BMET    Component Value Date/Time   NA 140 09/18/2023 0837   K 4.3 09/18/2023 0837   CL 102 09/18/2023 0837   CO2 22 09/18/2023 0837   GLUCOSE 91 09/18/2023 0837   BUN 11 09/18/2023 0837   CREATININE 0.86 09/18/2023 0837   CALCIUM 9.8 09/18/2023 0837   Lab Results  Component Value Date   HGBA1C 5.4 09/18/2023   HGBA1C 5.7 (H) 06/06/2023   Lab Results  Component Value Date   INSULIN  16.4 09/18/2023   Lab Results  Component Value Date   TSH 2.360 09/18/2023   CBC    Component Value Date/Time   WBC 6.9 09/18/2023 0837   RBC 4.32 09/18/2023 0837   HGB 12.8 09/18/2023 0837   HCT 40.1 09/18/2023 0837   PLT 250 09/18/2023 0837   MCV 93  09/18/2023 0837   MCH 29.6 09/18/2023 0837   MCHC 31.9 09/18/2023 0837   RDW 11.8 09/18/2023 0837   Iron Studies    Component Value Date/Time   FERRITIN 51 09/18/2023 0837   Lipid Panel     Component Value Date/Time   CHOL 204 (H) 09/18/2023 0837   TRIG 201 (H) 09/18/2023 0837   HDL 39 (L) 09/18/2023 0837   CHOLHDL 4.1 06/06/2023 1048   LDLCALC 129 (H) 09/18/2023 0837   Hepatic Function Panel     Component Value Date/Time   PROT 6.4 09/18/2023 0837   ALBUMIN 4.3 09/18/2023 0837   AST 15 09/18/2023 0837   ALT 10 09/18/2023 0837   ALKPHOS 74 09/18/2023 0837   BILITOT 0.4 09/18/2023 0837      Component Value Date/Time   TSH 2.360 09/18/2023 0837   Nutritional Lab  Results  Component Value Date   VD25OH 44.3 09/18/2023     ASSESSMENT AND PLAN  TREATMENT PLAN FOR OBESITY:  Recommended Dietary Goals  Nancy Stewart is currently in the action stage of change. As such, her goal is to continue weight management plan. She has agreed to keeping a food journal and adhering to recommended goals of 1400-1500 calories and 80 grams of protein. Needs to increase protein intake.  Will review macros at next visit.    Behavioral Intervention  We discussed the following Behavioral Modification Strategies today: increasing lean protein intake to established goals, decreasing simple carbohydrates , increasing vegetables, increasing fiber rich foods, increasing water intake , work on meal planning and preparation, work on tracking and journaling calories using tracking application, reading food labels , keeping healthy foods at home, and continue to work on maintaining a reduced calorie state, getting the recommended amount of protein, incorporating whole foods, making healthy choices, staying well hydrated and practicing mindfulness when eating..  Additional resources provided today: NA  Recommended Physical Activity Goals  Nancy Stewart has been advised to work up to 150 minutes of moderate intensity aerobic activity a week and strengthening exercises 2-3 times per week for cardiovascular health, weight loss maintenance and preservation of muscle mass.   She has agreed to Think about enjoyable ways to increase daily physical activity and overcoming barriers to exercise, Increase physical activity in their day and reduce sedentary time (increase NEAT)., and continue to gradually increase the amount and intensity of exercise routine    ASSOCIATED CONDITIONS ADDRESSED TODAY  Action/Plan  Prediabetes Patient doesn't want to restart Metformin  at this time  Nancy Stewart will continue to work on weight loss, exercise, and decreasing simple carbohydrates to help decrease the risk of  diabetes.    Class 1 obesity due to excess calories without serious comorbidity with body mass index (BMI) of 32.0 to 32.9 in adult     Information given on Qsymia and Contrave.  Would need to monitor mood if she decides to start either medication.  Patient is not sexually active.  Did discuss would need to prevent pregnancies if she decides to start a weight loss medication.      Return in about 2 weeks (around 11/13/2023).Nancy Stewart She was informed of the importance of frequent follow up visits to maximize her success with intensive lifestyle modifications for her multiple health conditions.   ATTESTASTION STATEMENTS:  Reviewed by clinician on day of visit: allergies, medications, problem list, medical history, surgical history, family history, social history, and previous encounter notes.   Time spent on visit including pre-visit chart review and post-visit care and charting was 30+  minutes.    Corean SAUNDERS. Sherrilynn Gudgel FNP-C

## 2023-11-06 ENCOUNTER — Ambulatory Visit: Admitting: Nurse Practitioner

## 2023-11-17 ENCOUNTER — Encounter: Payer: Self-pay | Admitting: Dietician

## 2023-11-17 ENCOUNTER — Encounter: Attending: Obstetrics and Gynecology | Admitting: Dietician

## 2023-11-17 DIAGNOSIS — E282 Polycystic ovarian syndrome: Secondary | ICD-10-CM | POA: Insufficient documentation

## 2023-11-17 NOTE — Progress Notes (Signed)
 Medical Nutrition Therapy  Appointment Start time:  0800  Appointment End time:  0815  Primary concerns today: PCOS and nutrition   Referral diagnosis: PCOS Preferred learning style: no preference indicated Learning readiness: ready   NUTRITION ASSESSMENT   Anthropometrics   Wt 05/31/23: 180 lb  Clinical Medical Hx: anxiety, depression, PCOS, prediabetes, HLD Medications: reviewed Labs: A1c 5.4% on 09/18/23. Chol 204, HDL 39, trig 201, LDL 129 Notable Signs/Symptoms: none reported Food Allergies: none reported  Lifestyle & Dietary Hx  A1c down from 5.7% on 06/06/23 to 5.4% on 09/18/23.   Pt reports she is seeing another dietitian and has been starting to track macros. Pt reports she is aiming for 80 g protein daily. Pt states she increased meat consumption to do this.   Pt reports she has continued walking and has also been adding in some jogging and biking. Pt states she aims to do 3-4 days per week for 60 minutes.   Pt reports since starting inositol she has notices periods are more regular, sleeping better, and less bloating.  Estimated daily fluid intake: 80-120 oz Supplements: inositol Sleep: 8 hours Stress / self-care: moderate stress Current average weekly physical activity: biking 2x/wk for 30-45 minutes, occasional hiking  24-Hr Dietary Recall First Meal: 10am: 2 eggs with veggies and cheese and chicken apple sausage Snack: none  Second Meal: 1:30pm: sandwich ([place] and cheese, lettuce, tomato) and apple Snack: cheese, nut, dried fruit packet Third Meal: sweet potatoes, beans, sour cream Snack:  Beverages: 1 poppi, 80-120 oz water   NUTRITION DIAGNOSIS  NB-1.1 Food and nutrition-related knowledge deficit As related to lack of prior education by a registered dietitian.  As evidenced by pt report.   NUTRITION INTERVENTION  Nutrition education (E-1) on the following topics:   Nutrition for Cholesterol:  Soluble fiber and impact on lowering cholesterol:  Sources: Oats, barley, beans, lentils, fruits (apples, oranges), vegetables (carrots, broccoli), and flaxseeds. Benefits: Soluble fiber reduces the absorption of cholesterol into your bloodstream. Choose Healthy Unsaturated Fats and Omega 3's. Sources: Olive oil, canola oil, avocados, nuts, and Fatty fish (salmon, trout), walnuts, flaxseeds, and chia seeds. Benefits: Helps raise HDL cholesterol and lower LDL cholesterol. Omega-3s help reduce triglycerides and raise HDL cholesterol. Limit Saturated Fats: Sources: Red meat, full-fat dairy products, butter, and coconut oil. Benefits: Reducing intake helps lower LDL cholesterol levels. Eat Plenty of Fruits and Vegetables. Benefits: High in fiber and antioxidants, which help lower LDL cholesterol. Eat Whole Grains. Sources: Brown rice, whole wheat bread, quinoa, and whole grain pasta. Benefits: Whole grains contain more fiber and nutrients than refined grains. Regular Physical Activity: Aim for at least 30 minutes of moderate-intensity exercise most days of the week.  Assessed list of non-starchy vegetables and identified patients' preferred ones, including broccoli, green beans, cucumber, celery, eggplant, squash, tomato, salad greens, and peppers.   Prediabetes Prediabetes: Prediabetes is a condition where blood sugar levels are higher than normal but not yet high enough to be diagnosed as type 2 diabetes. A1C, or hemoglobin A1c, is a blood test that provides an average of a person's blood sugar levels over the past two to three months. It is commonly used to diagnose and monitor diabetes. For prediabetes, an A1C level between 5.7% and 6.4% typically is used to diagnose this. Here is how the A1C levels are generally categorized: Normal:  A1C below 5.7% Prediabetes:  A1C between 5.7% and 6.4% Diabetes:  A1C of 6.5% or higher When diagnosed with prediabetes, there are several lifestyle changes  you can make to manage the condition: Healthy Eating:  Follow  a well-balanced diet that includes a variety of fruits, vegetables, whole grains, lean proteins, and healthy fats. Monitor portion sizes and reduce intake of sugary and processed foods. Regular Physical Activity:  Engage in regular physical activity, such as brisk walking, cycling, or other aerobic exercises, for at least 150 minutes per week. Include strength training exercises at least twice a week. Weight Management: Achieve and maintain a healthy weight. Losing even a small amount of weight (3-5%) can significantly improve insulin  sensitivity.  Plate Method Fruits & Vegetables: Aim to fill half your plate with a variety of fruits and vegetables. They are rich in vitamins, minerals, and fiber, and can help reduce the risk of chronic diseases. Choose a colorful assortment of fruits and vegetables to ensure you get a wide range of nutrients. Grains and Starches: Make at least half of your grain choices whole grains, such as brown rice, whole wheat bread, and oats. Whole grains provide fiber, which aids in digestion and healthy cholesterol levels. Aim for whole forms of starchy vegetables such as potatoes, sweet potatoes, beans, peas, and corn, which are fiber rich and provide many vitamins and minerals.  Protein: Incorporate lean sources of protein, such as poultry, fish, beans, nuts, and seeds, into your meals. Protein is essential for building and repairing tissues, staying full, balancing blood sugar, as well as supporting immune function. Dairy: Include low-fat or fat-free dairy products like milk, yogurt, and cheese in your diet. Dairy foods are excellent sources of calcium and vitamin D , which are crucial for bone health.   Nutritional Strategies for PCOS 1. Balance Blood Sugar & Insulin  Levels - Low Glycemic Index (GI) Foods: Choose whole grains, legumes, and non-starchy vegetables to prevent blood sugar spikes. - Fiber-Rich Diet: Helps slow down glucose absorption. Include foods like  flaxseeds, chia seeds, lentils, leafy greens, and berries. - Healthy Fats: Avocados, nuts, seeds, and olive oil.  2. Prioritize Protein & Healthy Fats - Lean Proteins: Chicken, tofu, eggs, fish, and legumes support muscle mass and help regulate hunger. - Avoid Trans Fats: Found in processed foods, fried items, and margarine, as they can worsen inflammation and insulin  resistance.  3. Reduce Processed Carbs & Sugar - Limit Sugary Beverages & Refined Carbs: Soda, white bread, pastries, and candy contribute to insulin  resistance. - Use Natural Sweeteners Sparingly: Honey, stevia, and monk fruit are better alternatives in moderation.  4. Stay Hydrated & Manage Stress - Drink Plenty of Water. - Manage Stress: Chronic stress affects cortisol and insulin  levels, worsening PCOS symptoms. Mindful eating, yoga, and meditation can help.  Handouts Provided Include (initial assessment) Inositol and PCOS  Learning Style & Readiness for Change Teaching method utilized: Visual & Auditory  Demonstrated degree of understanding via: Teach Back  Barriers to learning/adherence to lifestyle change: none  Goals Established by Pt  Assessment and continuation of previous goals:  Goal 1: aim to eat 2 servings of non-starchy vegetables per day. - goal in progress, continue working toward adding in another serving.   Goal 2: Maintain walking most days after work. - doing a variety of walking, jogging, and biking.    MONITORING & EVALUATION Dietary intake, weekly physical activity, and follow up PRN.   Next Steps  Patient is to call for questions.

## 2023-11-17 NOTE — Patient Instructions (Signed)
 Nutrition for Cholesterol:  Soluble fiber and impact on lowering cholesterol: Sources: Oats, barley, beans, lentils, fruits (apples, oranges), vegetables (carrots, broccoli), and flaxseeds. Benefits: Soluble fiber reduces the absorption of cholesterol into your bloodstream.  Choose Healthy Unsaturated Fats and Omega 3's. Sources: Olive oil, canola oil, avocados, nuts, and Fatty fish (salmon, trout), walnuts, flaxseeds, and chia seeds. Benefits: Helps raise HDL cholesterol and lower LDL cholesterol. Omega-3s help reduce triglycerides and raise HDL cholesterol.  Limit Saturated Fats: Sources: Red meat, full-fat dairy products, butter, and coconut oil. Benefits: Reducing intake helps lower LDL cholesterol levels.  Eat Plenty of Fruits and Vegetables. Benefits: High in fiber and antioxidants, which help lower LDL cholesterol.  Eat Whole Grains. Sources: Brown rice, whole wheat bread, quinoa, and whole grain pasta. Benefits: Whole grains contain more fiber and nutrients than refined grains.  Regular Physical Activity: Aim for at least 30 minutes of moderate-intensity exercise most days of the week.

## 2023-11-20 ENCOUNTER — Ambulatory Visit (INDEPENDENT_AMBULATORY_CARE_PROVIDER_SITE_OTHER): Admitting: Nurse Practitioner

## 2023-11-20 ENCOUNTER — Encounter: Payer: Self-pay | Admitting: Nurse Practitioner

## 2023-11-20 VITALS — BP 99/66 | HR 71 | Temp 98.2°F | Ht 62.0 in | Wt 172.0 lb

## 2023-11-20 DIAGNOSIS — Z6831 Body mass index (BMI) 31.0-31.9, adult: Secondary | ICD-10-CM

## 2023-11-20 DIAGNOSIS — E66811 Obesity, class 1: Secondary | ICD-10-CM

## 2023-11-20 DIAGNOSIS — R7303 Prediabetes: Secondary | ICD-10-CM | POA: Diagnosis not present

## 2023-11-20 NOTE — Progress Notes (Signed)
 Office: [phone]  /  Fax: [phone]  WEIGHT SUMMARY AND BIOMETRICS  Weight Lost Since Last Visit: 3lb  Weight Gained Since Last Visit: 0lb   Vitals Temp: 98.2 F (36.8 C) BP: 99/66 Pulse Rate: 71 SpO2: 98 %   Anthropometric Measurements Height: 5' 2 (1.575 m) Weight: 172 lb (78 kg) BMI (Calculated): 31.45 Weight at Last Visit: 175lb Weight Lost Since Last Visit: 3lb Weight Gained Since Last Visit: 0lb Starting Weight: 176lb Total Weight Loss (lbs): 4 lb (1.814 kg)   Body Composition  Body Fat %: 39.3 % Fat Mass (lbs): 68 lbs Muscle Mass (lbs): 99.4 lbs Total Body Water (lbs): 73.4 lbs Visceral Fat Rating : 6   Other Clinical Data Fasting: Yes Labs: No Today's Visit #: 5 Starting Date: 09/18/23     HPI  Chief Complaint: OBESITY  Nancy Stewart is here to discuss her progress with her obesity treatment plan. She is on the the Category 2 Plan and states she is following her eating plan approximately 25 % of the time. (Recent wisdom tooth extraction) She states she is exercising 90 minutes 4 days per week.   Interval History:  Since last office visit she has lost 3 pounds. She is using mynetdiary and is averaging around 1500 calories, protein 40-97 grams of protein, 125-227 carbs and 23-61 fats.  She had 2 wisdom teeth removed 11/09/23 and is getting braces this week.  She hasn't been able to follow the meal plan due to her surgery.  She has been drinking protein shakes and water.  She is jogging/walking 4 days per week. She is planning to join the gym later this month.    Pharmacotherapy for weight loss: She is not currently taking medications  for medical weight loss.      Previous pharmacotherapy for medical weight loss:  None   Bariatric surgery:  Patient has not had bariatric surgery  Prediabetes Last A1c was 5.4  Medication(s): none Polyphagia:Yes She took Metformin  and stopped due to GI upset and diarrhea.    Lab Results  Component Value Date    HGBA1C 5.4 09/18/2023   HGBA1C 5.7 (H) 06/06/2023   Lab Results  Component Value Date   INSULIN  16.4 09/18/2023     PHYSICAL EXAM:  Blood pressure 99/66, pulse 71, temperature 98.2 F (36.8 C), height 5' 2 (1.575 m), weight 172 lb (78 kg), last menstrual period 10/14/2023, SpO2 98%. Body mass index is 31.46 kg/m.  General: She is overweight, cooperative, alert, well developed, and in no acute distress. PSYCH: Has normal mood, affect and thought process.   Extremities: No edema.  Neurologic: No gross sensory or motor deficits. No tremors or fasciculations noted.    DIAGNOSTIC DATA REVIEWED:  BMET    Component Value Date/Time   NA 140 09/18/2023 0837   K 4.3 09/18/2023 0837   CL 102 09/18/2023 0837   CO2 22 09/18/2023 0837   GLUCOSE 91 09/18/2023 0837   BUN 11 09/18/2023 0837   CREATININE 0.86 09/18/2023 0837   CALCIUM 9.8 09/18/2023 0837   Lab Results  Component Value Date   HGBA1C 5.4 09/18/2023   HGBA1C 5.7 (H) 06/06/2023   Lab Results  Component Value Date   INSULIN  16.4 09/18/2023   Lab Results  Component Value Date   TSH 2.360 09/18/2023   CBC    Component Value Date/Time   WBC 6.9 09/18/2023 0837   RBC 4.32 09/18/2023 0837   HGB 12.8 09/18/2023 0837   HCT 40.1 09/18/2023 0837  PLT 250 09/18/2023 0837   MCV 93 09/18/2023 0837   MCH 29.6 09/18/2023 0837   MCHC 31.9 09/18/2023 0837   RDW 11.8 09/18/2023 0837   Iron Studies    Component Value Date/Time   FERRITIN 51 09/18/2023 0837   Lipid Panel     Component Value Date/Time   CHOL 204 (H) 09/18/2023 0837   TRIG 201 (H) 09/18/2023 0837   HDL 39 (L) 09/18/2023 0837   CHOLHDL 4.1 06/06/2023 1048   LDLCALC 129 (H) 09/18/2023 0837   Hepatic Function Panel     Component Value Date/Time   PROT 6.4 09/18/2023 0837   ALBUMIN 4.3 09/18/2023 0837   AST 15 09/18/2023 0837   ALT 10 09/18/2023 0837   ALKPHOS 74 09/18/2023 0837   BILITOT 0.4 09/18/2023 0837      Component Value Date/Time    TSH 2.360 09/18/2023 0837   Nutritional Lab Results  Component Value Date   VD25OH 44.3 09/18/2023     ASSESSMENT AND PLAN  TREATMENT PLAN FOR OBESITY:  Recommended Dietary Goals  Nancy Stewart is currently in the action stage of change. As such, her goal is to continue weight management plan. She has agreed to track and will review macros at next visit.  She is low in protein intake and high in carb intake.  To continue tracking and will review at next visit.    Behavioral Intervention  We discussed the following Behavioral Modification Strategies today: increasing lean protein intake to established goals, decreasing simple carbohydrates , increasing vegetables, increasing fiber rich foods, increasing water intake , work on meal planning and preparation, work on tracking and journaling calories using tracking application, continue to work on maintaining a reduced calorie state, getting the recommended amount of protein, incorporating whole foods, making healthy choices, staying well hydrated and practicing mindfulness when eating., and increase protein intake, fibrous foods (25 grams per day for women, 30 grams for men) and water to improve satiety and decrease hunger signals. .  Additional resources provided today: NA  Recommended Physical Activity Goals  Nancy Stewart has been advised to work up to 150 minutes of moderate intensity aerobic activity a week and strengthening exercises 2-3 times per week for cardiovascular health, weight loss maintenance and preservation of muscle mass.   She has agreed to Think about enjoyable ways to increase daily physical activity and overcoming barriers to exercise, Increase physical activity in their day and reduce sedentary time (increase NEAT)., Start strengthening exercises with a goal of 2-3 sessions a week , Continue to gradually increase the amount and intensity of exercise routine, and Combine aerobic and strengthening exercises for efficiency and  improved cardiometabolic health.   ASSOCIATED CONDITIONS ADDRESSED TODAY  Action/Plan  Prediabetes She is not interested in restarting metformin  at this time.  She stopped in the past due to side effects of GI upset and diarrhea.  Nancy Stewart will continue to work on weight loss, exercise, and decreasing simple carbohydrates to help decrease the risk of diabetes.    Class 1 obesity due to excess calories without serious comorbidity with body mass index (BMI) of 31.0 to 31.9 in adult  Avoid Qsymia due to taking Lamictal 150 mg twice daily  She is interested in starting Contrave in the future.  However she is taking BuSpar, Lamictal and Caplyta.  She is seeing her psychiatrist today and I would like her to discuss Contrave with her psychiatrist prior to starting.       Bio Impedance reviewed with patient  Her  body fat % decreased, muscle mass decreased 0.8 lb and fat mass decreased.      Return in about 4 weeks (around 12/18/2023).SABRA She was informed of the importance of frequent follow up visits to maximize her success with intensive lifestyle modifications for her multiple health conditions.   ATTESTASTION STATEMENTS:  Reviewed by clinician on day of visit: allergies, medications, problem list, medical history, surgical history, family history, social history, and previous encounter notes.   I personally spent a total of 35 minutes in the care of the patient today including preparing to see the patient, getting/reviewing separately obtained history, counseling and educating, and documenting clinical information in the EHR.    Corean SAUNDERS. Sherin Murdoch FNP-C

## 2023-11-23 ENCOUNTER — Encounter: Payer: Self-pay | Admitting: Family Medicine

## 2023-11-27 ENCOUNTER — Ambulatory Visit: Admitting: Family Medicine

## 2023-11-27 ENCOUNTER — Ambulatory Visit

## 2023-11-27 DIAGNOSIS — Z111 Encounter for screening for respiratory tuberculosis: Secondary | ICD-10-CM | POA: Insufficient documentation

## 2023-11-28 ENCOUNTER — Ambulatory Visit: Admitting: Family Medicine

## 2023-11-28 ENCOUNTER — Encounter: Payer: Self-pay | Admitting: Family Medicine

## 2023-11-28 ENCOUNTER — Ambulatory Visit (INDEPENDENT_AMBULATORY_CARE_PROVIDER_SITE_OTHER): Admitting: Family Medicine

## 2023-11-28 VITALS — BP 110/71 | HR 94 | Temp 98.7°F | Ht 62.0 in | Wt 172.0 lb

## 2023-11-28 DIAGNOSIS — Z111 Encounter for screening for respiratory tuberculosis: Secondary | ICD-10-CM

## 2023-11-28 DIAGNOSIS — Z23 Encounter for immunization: Secondary | ICD-10-CM

## 2023-11-28 NOTE — Assessment & Plan Note (Signed)
 Tolerated vaccine well

## 2023-11-28 NOTE — Assessment & Plan Note (Signed)
 [employer] TB Gold Plus today for new job requirement.

## 2023-11-28 NOTE — Progress Notes (Signed)
   Established Patient Office Visit  Subjective   Patient ID: Nancy Stewart, female    DOB: [birth year]  Age: 18 y.o. 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 Needs TB screening. Starts new job on 12/11/23      ROS    Objective:     BP 110/71   Pulse 94   Temp 98.7 F (37.1 C) (Oral)   Ht 5' 2 (1.575 m)   Wt 172 lb (78 kg)   LMP 11/14/2023 (Exact Date)   SpO2 97%   BMI 31.46 kg/m    Physical Exam Vitals and nursing note reviewed.  Constitutional:      General: She is not in acute distress.    Appearance: Normal appearance.  Cardiovascular:     Rate and Rhythm: Normal rate.     Heart sounds: Normal heart sounds.  Pulmonary:     Effort: Pulmonary effort is normal.     Breath sounds: Normal breath sounds.  Skin:    General: Skin is warm and dry.  Neurological:     General: No focal deficit present.     Mental Status: She is alert. Mental status is at baseline.  Psychiatric:        Mood and Affect: Mood normal.        Behavior: Behavior normal.        Thought Content: Thought content normal.        Judgment: Judgment normal.      No results found for any visits on 11/28/23.    The ASCVD Risk score (Arnett DK, et al., 2019) failed to calculate for the following reasons:   The 2019 ASCVD risk score is only valid for ages 29 to 54    Assessment & Plan:   Problem List Items Addressed This Visit     Encounter for administration of vaccine   Tolerated vaccine well.       Relevant Orders   Flu vaccine trivalent PF, 6mos and older(Flulaval,Afluria,Fluarix,Fluzone) (Completed)   Screening examination for pulmonary tuberculosis - Primary   [employer] TB Gold Plus today for new job requirement.       Relevant Orders   QuantiFERON-TB Gold Plus  Agrees with plan of care discussed.  Questions  answered.   Return if symptoms worsen or fail to improve.    Darice JONELLE Brownie, FNP

## 2023-12-01 ENCOUNTER — Ambulatory Visit: Payer: Self-pay | Admitting: Family Medicine

## 2023-12-01 LAB — QUANTIFERON-TB GOLD PLUS
QuantiFERON Mitogen Value: >10 [IU]/mL
QuantiFERON Nil Value: 0.03 [IU]/mL
QuantiFERON TB1 Ag Value: 0.04 [IU]/mL
QuantiFERON TB2 Ag Value: 0.04 [IU]/mL
QuantiFERON-TB Gold Plus: NEGATIVE

## 2023-12-11 ENCOUNTER — Ambulatory Visit: Admitting: Nurse Practitioner

## 2023-12-14 ENCOUNTER — Ambulatory Visit: Admitting: Nurse Practitioner

## 2023-12-23 ENCOUNTER — Other Ambulatory Visit: Payer: Self-pay | Admitting: Family Medicine

## 2023-12-23 DIAGNOSIS — J0100 Acute maxillary sinusitis, unspecified: Secondary | ICD-10-CM

## 2024-01-01 ENCOUNTER — Telehealth (INDEPENDENT_AMBULATORY_CARE_PROVIDER_SITE_OTHER): Admitting: Nurse Practitioner

## 2024-01-01 DIAGNOSIS — E66811 Obesity, class 1: Secondary | ICD-10-CM | POA: Diagnosis not present

## 2024-01-01 DIAGNOSIS — R7303 Prediabetes: Secondary | ICD-10-CM

## 2024-01-01 DIAGNOSIS — Z6831 Body mass index (BMI) 31.0-31.9, adult: Secondary | ICD-10-CM | POA: Diagnosis not present

## 2024-01-01 NOTE — Progress Notes (Signed)
 Office: [phone]  /  Fax: [phone]  WEIGHT SUMMARY AND BIOMETRICS  TeleHealth Visit:  This visit was completed with telemedicine (audio/video) technology. Catheryn has verbally consented to this TeleHealth visit. The patient is located at work in [place], the provider is located at [employer] in [place]. The participants in this visit include the listed provider and patient. The visit was conducted today via MyChart video.   HPI  Chief Complaint: OBESITY  Lien is here via video visit to discuss her progress with her obesity treatment plan.    Interval History:  Current weight:  unsure-doesn't have a scale at home.    She got braces since her last visit.  She struggled with pain for several days afterwards and wasn't able to follow her meal plan.  She started 2 new jobs and is struggling with eating 3 meals per day.  She is trying to figure out her new work scheduled.  She is eating 2 meals per day-around 900 calories. She is snacking on cottage cheese and vegetables, greek yogurt with honey or peanut butter and apples.  She is drinking water, a protein shake (4 days per week) and Bubbl'r daily.  She is doing a walk/jog 3 days per week and is working at [employer] 3 days per week.    Pharmacotherapy for weight loss: She is not currently taking medications  for medical weight loss.     She talked with her psychiatrist and was started on Adderall.  Has helped with decreasing her anxiety and has helped with polyphagia.  She feels it did decreased her appetite significantly and that's why she was eating around 900 calories per day    Previous pharmacotherapy for medical weight loss:  None   Bariatric surgery:  Patient has not had bariatric surgery  Prediabetes Last A1c was 5.4-improved  Medication(s): None-took Metformin  in the past and stopped due to GI upset and diarrhea.   Polyphagia:No  Lab Results  Component Value Date   HGBA1C 5.4 09/18/2023    HGBA1C 5.7 (H) 06/06/2023   Lab Results  Component Value Date   INSULIN  16.4 09/18/2023     PHYSICAL EXAM:  There were no vitals taken for this visit. There is no height or weight on file to calculate BMI.  General: She is overweight, cooperative, alert, well developed, and in no acute distress. PSYCH: Has normal mood, affect and thought process.   Extremities: No edema.  Neurologic: No gross sensory or motor deficits. No tremors or fasciculations noted.    DIAGNOSTIC DATA REVIEWED:  BMET    Component Value Date/Time   NA 140 09/18/2023 0837   K 4.3 09/18/2023 0837   CL 102 09/18/2023 0837   CO2 22 09/18/2023 0837   GLUCOSE 91 09/18/2023 0837   BUN 11 09/18/2023 0837   CREATININE 0.86 09/18/2023 0837   CALCIUM 9.8 09/18/2023 0837   Lab Results  Component Value Date   HGBA1C 5.4 09/18/2023   HGBA1C 5.7 (H) 06/06/2023   Lab Results  Component Value Date   INSULIN  16.4 09/18/2023   Lab Results  Component Value Date   TSH 2.360 09/18/2023   CBC    Component Value Date/Time   WBC 6.9 09/18/2023 0837   RBC 4.32 09/18/2023 0837   HGB 12.8 09/18/2023 0837   HCT 40.1 09/18/2023 0837   PLT 250 09/18/2023 0837   MCV 93 09/18/2023 0837   MCH 29.6 09/18/2023 0837   MCHC 31.9 09/18/2023 0837   RDW 11.8 09/18/2023  9162   Iron Studies    Component Value Date/Time   FERRITIN 51 09/18/2023 0837   Lipid Panel     Component Value Date/Time   CHOL 204 (H) 09/18/2023 0837   TRIG 201 (H) 09/18/2023 0837   HDL 39 (L) 09/18/2023 0837   CHOLHDL 4.1 06/06/2023 1048   LDLCALC 129 (H) 09/18/2023 0837   Hepatic Function Panel     Component Value Date/Time   PROT 6.4 09/18/2023 0837   ALBUMIN 4.3 09/18/2023 0837   AST 15 09/18/2023 0837   ALT 10 09/18/2023 0837   ALKPHOS 74 09/18/2023 0837   BILITOT 0.4 09/18/2023 0837      Component Value Date/Time   TSH 2.360 09/18/2023 0837   Nutritional Lab Results  Component Value Date   VD25OH 44.3 09/18/2023      ASSESSMENT AND PLAN  TREATMENT PLAN FOR OBESITY:  Recommended Dietary Goals  Zuleyka is currently in the action stage of change. As such, her goal is to continue weight management plan. She has agreed to keeping a food journal and adhering to recommended goals of 1200 calories and 75+ grams protein.  We discussed the importance of not eating < 1200 calories per day and aiming to meet her calories and protein goals.    Behavioral Intervention  We discussed the following Behavioral Modification Strategies today: increasing lean protein intake to established goals, decreasing simple carbohydrates , increasing vegetables, increasing fiber rich foods, increasing water intake , work on meal planning and preparation, work on tracking and journaling calories using tracking application, reading food labels , keeping healthy foods at home, continue to work on maintaining a reduced calorie state, getting the recommended amount of protein, incorporating whole foods, making healthy choices, staying well hydrated and practicing mindfulness when eating., and increase protein intake, fibrous foods (25 grams per day for women, 30 grams for men) and water to improve satiety and decrease hunger signals. .  Additional resources provided today: NA  Recommended Physical Activity Goals  Edona has been advised to work up to 150 minutes of moderate intensity aerobic activity a week and strengthening exercises 2-3 times per week for cardiovascular health, weight loss maintenance and preservation of muscle mass.   She has agreed to Think about enjoyable ways to increase daily physical activity and overcoming barriers to exercise, Increase physical activity in their day and reduce sedentary time (increase NEAT)., Start strengthening exercises with a goal of 2-3 sessions a week , Continue to gradually increase the amount and intensity of exercise routine, Increase volume of physical activity to a goal of 240 minutes  a week, and Combine aerobic and strengthening exercises for efficiency and improved cardiometabolic health.   Pharmacotherapy We discussed various medication options to help Pleasant with her weight loss efforts and we both agreed to continue to follow up with her psychiatrist.  She recently started Adderall and is struggling with meeting her protein and calorie goals.  Avoid Phentermine due to taking Adderall Avoid Qsymia due to taking Lamictal and Adderall  Avoid Contrave due to taking Lamictal, Adderall and Caplyta.  Would need to discuss changes in medication prior to starting if interested in the future.   Unable to start GLP-1 due to cost.   ASSOCIATED CONDITIONS ADDRESSED TODAY  Action/Plan  Prediabetes Brayli will continue to work on weight loss, exercise, and decreasing simple carbohydrates to help decrease the risk of diabetes.    Class 1 obesity due to excess calories without serious comorbidity with body mass index (BMI)  of 31.0 to 31.9 in adult      Goals: To start eating breakfast Start resistance training   Return in about 2 weeks (around 01/15/2024).SABRA She was informed of the importance of frequent follow up visits to maximize her success with intensive lifestyle modifications for her multiple health conditions.   ATTESTASTION STATEMENTS:  Reviewed by clinician on day of visit: allergies, medications, problem list, medical history, surgical history, family history, social history, and previous encounter notes.   I personally spent a total of 33 minutes in the care of the patient today including preparing to see the patient, getting/reviewing separately obtained history, counseling and educating, documenting clinical information in the EHR, and coordinating care.    Corean SAUNDERS. Khloee Garza FNP-C

## 2024-01-05 ENCOUNTER — Encounter: Payer: Self-pay | Admitting: Nurse Practitioner

## 2024-01-17 ENCOUNTER — Encounter: Payer: Self-pay | Admitting: Family Medicine

## 2024-01-22 ENCOUNTER — Ambulatory Visit: Admitting: Nurse Practitioner

## 2024-01-22 ENCOUNTER — Encounter: Payer: Self-pay | Admitting: Nurse Practitioner

## 2024-01-22 VITALS — BP 87/58 | HR 64 | Temp 98.3°F | Ht 62.0 in | Wt 159.0 lb

## 2024-01-22 DIAGNOSIS — T148XXA Other injury of unspecified body region, initial encounter: Secondary | ICD-10-CM

## 2024-01-22 DIAGNOSIS — Z79899 Other long term (current) drug therapy: Secondary | ICD-10-CM | POA: Diagnosis not present

## 2024-01-22 DIAGNOSIS — E669 Obesity, unspecified: Secondary | ICD-10-CM | POA: Diagnosis not present

## 2024-01-22 DIAGNOSIS — Z6829 Body mass index (BMI) 29.0-29.9, adult: Secondary | ICD-10-CM

## 2024-01-22 NOTE — Progress Notes (Signed)
 Office: [phone]  /  Fax: [phone]  WEIGHT SUMMARY AND BIOMETRICS  Weight Lost Since Last Visit: 13lb  Weight Gained Since Last Visit: 0lb   Vitals Temp: 98.3 F (36.8 C) BP: (!) 87/58 Pulse Rate: 64 SpO2: 100 %   Anthropometric Measurements Height: 5' 2 (1.575 m) Weight: 159 lb (72.1 kg) BMI (Calculated): 29.07 Weight at Last Visit: 172lb Weight Lost Since Last Visit: 13lb Weight Gained Since Last Visit: 0lb Starting Weight: 176lb Total Weight Loss (lbs): 17 lb (7.711 kg)   Body Composition  Body Fat %: 36.2 % Fat Mass (lbs): 57.8 lbs Muscle Mass (lbs): 96.4 lbs Total Body Water (lbs): 72 lbs Visceral Fat Rating : 4   Other Clinical Data Fasting: No Labs: No Today's Visit #: 7 Starting Date: 09/18/23     HPI  Chief Complaint: OBESITY  Nancy Stewart is here to discuss her progress with her obesity treatment plan. Nancy Stewart is on the the Category 2 Plan and states Nancy Stewart is following her eating plan approximately 60 % of the time. Nancy Stewart states Nancy Stewart is exercising 90 minutes 4 days per week.   Interval History:  Since last office visit Nancy Stewart has lost 13 pounds.  Nancy Stewart is doing better with pain and eating since getting her braces.  Nancy Stewart is eating 2 meals and occ snacks (greek yogurt).  Nancy Stewart is drinking water, a protein shake and juice daily.  Nancy Stewart is trying to stop all caffeine.  Nancy Stewart is doing a walk/jog 3 days per week and is working at [employer] 3 days per week. Nancy Stewart recently joined [employer].    Nancy Stewart does note some bruising over the past several weeks.  Nancy Stewart started taking Lamictal 5 weeks ago and the dose was increased 2 weeks ago. Nancy Stewart is taking Motrin  PRN.    Pharmacotherapy for weight loss: Nancy Stewart is not currently taking medications  for medical weight loss.    Nancy Stewart is taking Adderall 20mg  (increased by psychiatrist since her last visit) daily prescribed by her psychiatrist.  Nancy Stewart may have her Adderall increased at her next visit.   Previous pharmacotherapy for  medical weight loss:  None   Bariatric surgery:  Nancy Stewart has not had bariatric surgery    PHYSICAL EXAM:  Blood pressure (!) 87/58, pulse 64, temperature 98.3 F (36.8 C), height 5' 2 (1.575 m), weight 159 lb (72.1 kg), last menstrual period 12/08/2023, SpO2 100%. Body mass index is 29.08 kg/m.  General: Nancy Stewart is overweight, cooperative, alert, well developed, and in no acute distress. PSYCH: Has normal mood, affect and thought process.   Extremities: No edema.  Neurologic: No gross sensory or motor deficits. No tremors or fasciculations noted.    DIAGNOSTIC DATA REVIEWED:  BMET    Component Value Date/Time   NA 140 09/18/2023 0837   K 4.3 09/18/2023 0837   CL 102 09/18/2023 0837   CO2 22 09/18/2023 0837   GLUCOSE 91 09/18/2023 0837   BUN 11 09/18/2023 0837   CREATININE 0.86 09/18/2023 0837   CALCIUM 9.8 09/18/2023 0837   Lab Results  Component Value Date   HGBA1C 5.4 09/18/2023   HGBA1C 5.7 (H) 06/06/2023   Lab Results  Component Value Date   INSULIN  16.4 09/18/2023   Lab Results  Component Value Date   TSH 2.360 09/18/2023   CBC    Component Value Date/Time   WBC 6.9 09/18/2023 0837   RBC 4.32 09/18/2023 0837   HGB 12.8 09/18/2023 0837   HCT 40.1 09/18/2023 0837  PLT 250 09/18/2023 0837   MCV 93 09/18/2023 0837   MCH 29.6 09/18/2023 0837   MCHC 31.9 09/18/2023 0837   RDW 11.8 09/18/2023 0837   Iron Studies    Component Value Date/Time   FERRITIN 51 09/18/2023 0837   Lipid Panel     Component Value Date/Time   CHOL 204 (H) 09/18/2023 0837   TRIG 201 (H) 09/18/2023 0837   HDL 39 (L) 09/18/2023 0837   CHOLHDL 4.1 06/06/2023 1048   LDLCALC 129 (H) 09/18/2023 0837   Hepatic Function Panel     Component Value Date/Time   PROT 6.4 09/18/2023 0837   ALBUMIN 4.3 09/18/2023 0837   AST 15 09/18/2023 0837   ALT 10 09/18/2023 0837   ALKPHOS 74 09/18/2023 0837   BILITOT 0.4 09/18/2023 0837      Component Value Date/Time   TSH 2.360 09/18/2023  0837   Nutritional Lab Results  Component Value Date   VD25OH 44.3 09/18/2023     ASSESSMENT AND PLAN  TREATMENT PLAN FOR OBESITY:  Recommended Dietary Goals  Thatiana is currently in the action stage of change. As such, her goal is to continue weight management plan. Nancy Stewart has agreed to keeping a food journal and adhering to recommended goals of 1200 calories and 80+ grams of protein.  Behavioral Intervention  We discussed the following Behavioral Modification Strategies today: increasing lean protein intake to established goals, decreasing simple carbohydrates , increasing vegetables, increasing fiber rich foods, increasing water intake , work on meal planning and preparation, reading food labels , keeping healthy foods at home, continue to work on maintaining a reduced calorie state, getting the recommended amount of protein, incorporating whole foods, making healthy choices, staying well hydrated and practicing mindfulness when eating., and increase protein intake, fibrous foods (25 grams per day for women, 30 grams for men) and water to improve satiety and decrease hunger signals. .  Additional resources provided today: NA  Recommended Physical Activity Goals  Osha has been advised to work up to 150 minutes of moderate intensity aerobic activity a week and strengthening exercises 2-3 times per week for cardiovascular health, weight loss maintenance and preservation of muscle mass.   Nancy Stewart has agreed to Think about enjoyable ways to increase daily physical activity and overcoming barriers to exercise, Increase physical activity in their day and reduce sedentary time (increase NEAT)., Start strengthening exercises with a goal of 2-3 sessions a week , Increase volume of physical activity to a goal of 240 minutes a week, and Combine aerobic and strengthening exercises for efficiency and improved cardiometabolic health.   Pharmacotherapy Avoid Phentermine due to taking Adderall Avoid  Qsymia due to taking Lamictal and Adderall  Avoid Contrave due to taking Lamictal, Adderall and Caplyta.  Would need to discuss changes in medication prior to starting if interested in the future.   Unable to start GLP-1 due to cost.   ASSOCIATED CONDITIONS ADDRESSED TODAY  Action/Plan  Bruising -     CBC with Differential/Platelet -     Comprehensive metabolic panel with GFR  Side effects of bruising noted with lamictal and caplyta.  Nancy Stewart is also taking Motrin  PRN.  I've asked her to reach out to her psychiatrist.  Labs obtained    Medication management -     CBC with Differential/Platelet -     Comprehensive metabolic panel with GFR  Generalized obesity  BMI 29.0-29.9,adult         Return in about 3 weeks (around 02/12/2024).SABRA Nancy Stewart was informed  of the importance of frequent follow up visits to maximize her success with intensive lifestyle modifications for her multiple health conditions.   ATTESTASTION STATEMENTS:  Reviewed by clinician on day of visit: allergies, medications, problem list, medical history, surgical history, family history, social history, and previous encounter notes.    Corean SAUNDERS. Alejos Reinhardt FNP-C

## 2024-01-23 ENCOUNTER — Encounter: Payer: Self-pay | Admitting: Nurse Practitioner

## 2024-01-23 LAB — CBC WITH DIFFERENTIAL/PLATELET
Basophils Absolute: 0 x10E3/uL (ref 0.0–0.2)
Basos: 0 %
EOS (ABSOLUTE): 0.1 x10E3/uL (ref 0.0–0.4)
Eos: 1 %
Hematocrit: 39.4 % (ref 34.0–46.6)
Hemoglobin: 12.6 g/dL (ref 11.1–15.9)
Immature Grans (Abs): 0 x10E3/uL (ref 0.0–0.1)
Immature Granulocytes: 0 %
Lymphocytes Absolute: 3.1 x10E3/uL (ref 0.7–3.1)
Lymphs: 37 %
MCH: 29.2 pg (ref 26.6–33.0)
MCHC: 32 g/dL (ref 31.5–35.7)
MCV: 91 fL (ref 79–97)
Monocytes Absolute: 0.6 x10E3/uL (ref 0.1–0.9)
Monocytes: 8 %
Neutrophils Absolute: 4.5 x10E3/uL (ref 1.4–7.0)
Neutrophils: 54 %
Platelets: 292 x10E3/uL (ref 150–450)
RBC: 4.31 x10E6/uL (ref 3.77–5.28)
RDW: 12 % (ref 11.7–15.4)
WBC: 8.3 x10E3/uL (ref 3.4–10.8)

## 2024-01-23 LAB — COMPREHENSIVE METABOLIC PANEL WITH GFR
ALT: 11 IU/L (ref 0–32)
AST: 17 IU/L (ref 0–40)
Albumin: 4.6 g/dL (ref 4.0–5.0)
Alkaline Phosphatase: 91 IU/L (ref 42–106)
BUN/Creatinine Ratio: 11 (ref 9–23)
BUN: 9 mg/dL (ref 6–20)
Bilirubin Total: 0.3 mg/dL (ref 0.0–1.2)
CO2: 26 mmol/L (ref 20–29)
Calcium: 10.1 mg/dL (ref 8.7–10.2)
Chloride: 101 mmol/L (ref 96–106)
Creatinine, Ser: 0.8 mg/dL (ref 0.57–1.00)
Globulin, Total: 2.4 g/dL (ref 1.5–4.5)
Glucose: 86 mg/dL (ref 70–99)
Potassium: 4.5 mmol/L (ref 3.5–5.2)
Sodium: 140 mmol/L (ref 134–144)
Total Protein: 7 g/dL (ref 6.0–8.5)
eGFR: 108 mL/min/1.73 (ref >59)

## 2024-01-31 ENCOUNTER — Encounter: Payer: Self-pay | Admitting: Obstetrics and Gynecology

## 2024-01-31 ENCOUNTER — Ambulatory Visit (INDEPENDENT_AMBULATORY_CARE_PROVIDER_SITE_OTHER): Admitting: Obstetrics and Gynecology

## 2024-01-31 VITALS — BP 121/75 | HR 92 | Ht 62.0 in | Wt 164.8 lb

## 2024-01-31 DIAGNOSIS — Z30430 Encounter for insertion of intrauterine contraceptive device: Secondary | ICD-10-CM | POA: Insufficient documentation

## 2024-01-31 LAB — POCT URINE PREGNANCY: Preg Test, Ur: NEGATIVE

## 2024-01-31 MED ORDER — LEVONORGESTREL 20 MCG/DAY IU IUD
1.0000 | INTRAUTERINE_SYSTEM | Freq: Once | INTRAUTERINE | Status: AC
  Administered 2024-01-31: 1 via INTRAUTERINE

## 2024-01-31 NOTE — Progress Notes (Signed)
 Pt presents for IUD insertion. Last unprotected sex 01-17-24

## 2024-01-31 NOTE — Patient Instructions (Signed)
 IUD PLACEMENT POST-PROCEDURE INSTRUCTIONS   You may take Ibuprofen, Aleve or Tylenol for pain if needed.  Cramping should resolve within in 24 hours.   You may have a small amount of spotting.  You should wear a mini pad for the next few days.     3.You need to call if you have any pelvic pain, fever, heavy bleeding or foul smelling vaginal discharge.  Irregular bleeding is common the first several months after having an IUD placed. You do not need to call for this reason unless you are concerned.   4.Shower or bathe as normal   5.You should have a follow-up appointment in 4-8 weeks for a re-check to make sure you are not having any problems.

## 2024-01-31 NOTE — Progress Notes (Signed)
   GYNECOLOGY CLINIC PROCEDURE NOTE  Nancy Stewart is a 18 y.o. G0P0000 here for  IUD insertion. No GYN concerns.  Last pap smear n/a < 19 y.o.  IUD Insertion Procedure Note Patient identified, informed consent performed, consent signed.   Discussed risks of irregular bleeding, cramping, infection, malpositioning or misplacement of the IUD outside the uterus which may require further procedure such as laparoscopy.  Lidocaine gel inserted prior to procedure  Time out was performed.  Urine pregnancy test negative.  Speculum placed in the vagina.  Cervix visualized.  Cleaned with Betadine x 2.  Grasped anteriorly with a single tooth tenaculum.  Uterus sounded to 7 cm.  Mirena   IUD placed per manufacturer's recommendations.  Strings trimmed to 3 cm. Tenaculum was removed, good hemostasis noted.  Patient tolerated procedure well.   Ibuprofen  provided in clinic for post procedure pain management  Patient was given post-procedure instructions.  She was advised to have backup contraception for one week.  Patient was also asked to check IUD strings periodically and follow up in 4 weeks for IUD check.  Nidia Daring, FNP

## 2024-02-12 ENCOUNTER — Ambulatory Visit: Admitting: Nurse Practitioner

## 2024-02-12 ENCOUNTER — Encounter: Payer: Self-pay | Admitting: Nurse Practitioner

## 2024-02-12 VITALS — BP 100/69 | HR 85 | Temp 98.2°F | Ht 62.0 in | Wt 162.0 lb

## 2024-02-12 DIAGNOSIS — E669 Obesity, unspecified: Secondary | ICD-10-CM | POA: Diagnosis not present

## 2024-02-12 DIAGNOSIS — Z6829 Body mass index (BMI) 29.0-29.9, adult: Secondary | ICD-10-CM | POA: Diagnosis not present

## 2024-02-12 DIAGNOSIS — F909 Attention-deficit hyperactivity disorder, unspecified type: Secondary | ICD-10-CM

## 2024-02-12 DIAGNOSIS — T148XXA Other injury of unspecified body region, initial encounter: Secondary | ICD-10-CM

## 2024-02-12 NOTE — Progress Notes (Signed)
 Office: [phone]  /  Fax: [phone]  WEIGHT SUMMARY AND BIOMETRICS  Weight Lost Since Last Visit: 0lb  Weight Gained Since Last Visit: 3lb   Vitals Temp: 98.2 F (36.8 C) BP: 100/69 Pulse Rate: 85 SpO2: 95 %   Anthropometric Measurements Height: 5' 2 (1.575 m) Weight: 162 lb (73.5 kg) BMI (Calculated): 29.62 Weight at Last Visit: 159lb Weight Lost Since Last Visit: 0lb Weight Gained Since Last Visit: 3lb Starting Weight: 176lb Total Weight Loss (lbs): 14 lb (6.35 kg)   Body Composition  Body Fat %: 36.6 % Fat Mass (lbs): 59.2 lbs Muscle Mass (lbs): 97.6 lbs Total Body Water (lbs): 72 lbs Visceral Fat Rating : 5   Other Clinical Data Fasting: No Labs: No Today's Visit #: 8 Starting Date: 09/18/23     HPI  Chief Complaint: OBESITY  Nancy Stewart is here to discuss her progress with her obesity treatment plan. She is on the the Category 2 Plan and states she is following her eating plan approximately 70 % of the time. She states she is exercising 60+ minutes 4 days per week.   Interval History:  Since last office visit she has gained 3 pounds.  She feels her weight gain is due to multiple thanksgiving celebrations. She is eating 2 meals per day and snacking a few days per week.  She is snacking on popcorn, [employer] chips and sometimes fruit.   She is drinking water, a protein shake and coffee.  She has decreased her caffeine intake.  She is going to the gym 4 days per week-bike, treadmill, stairs, etc.  She started working back at [employer] 2 days per week this past weekend.   Pharmacotherapy for weight loss: She is not currently taking medications  for medical weight loss.     She is taking Adderall 30mg  daily prescribed by her psychiatrist. Increased since her last visit. Since increasing to 30mg  she feels like she is eating more then she was on 20mg .  She also notes more side effects on 30mg -feeling like a zombie.    Previous pharmacotherapy for  medical weight loss:  None    Bariatric surgery:  Patient has not had bariatric surgery   Bruising Labs reviewed with patient  She reports bruising but feels is related to her job. She works at [employer] and is constantly hitting into people or items.   She got a new tattoo on 03/11/24.  No bruising noted near or around tattoo.     PHYSICAL EXAM:  Blood pressure 100/69, pulse 85, temperature 98.2 F (36.8 C), height 5' 2 (1.575 m), weight 162 lb (73.5 kg), last menstrual period 01/27/2024, SpO2 95%. Body mass index is 29.63 kg/m.  General: She is overweight, cooperative, alert, well developed, and in no acute distress. PSYCH: Has normal mood, affect and thought process.   Extremities: No edema.  Neurologic: No gross sensory or motor deficits. No tremors or fasciculations noted.    DIAGNOSTIC DATA REVIEWED:  BMET    Component Value Date/Time   NA 140 01/22/2024 1526   K 4.5 01/22/2024 1526   CL 101 01/22/2024 1526   CO2 26 01/22/2024 1526   GLUCOSE 86 01/22/2024 1526   BUN 9 01/22/2024 1526   CREATININE 0.80 01/22/2024 1526   CALCIUM 10.1 01/22/2024 1526   Lab Results  Component Value Date   HGBA1C 5.4 09/18/2023   HGBA1C 5.7 (H) 06/06/2023   Lab Results  Component Value Date   INSULIN  16.4 09/18/2023  Lab Results  Component Value Date   TSH 2.360 09/18/2023   CBC    Component Value Date/Time   WBC 8.3 01/22/2024 1526   RBC 4.31 01/22/2024 1526   HGB 12.6 01/22/2024 1526   HCT 39.4 01/22/2024 1526   PLT 292 01/22/2024 1526   MCV 91 01/22/2024 1526   MCH 29.2 01/22/2024 1526   MCHC 32.0 01/22/2024 1526   RDW 12.0 01/22/2024 1526   Iron Studies    Component Value Date/Time   FERRITIN 51 09/18/2023 0837   Lipid Panel     Component Value Date/Time   CHOL 204 (H) 09/18/2023 0837   TRIG 201 (H) 09/18/2023 0837   HDL 39 (L) 09/18/2023 0837   CHOLHDL 4.1 06/06/2023 1048   LDLCALC 129 (H) 09/18/2023 0837   Hepatic Function Panel      Component Value Date/Time   PROT 7.0 01/22/2024 1526   ALBUMIN 4.6 01/22/2024 1526   AST 17 01/22/2024 1526   ALT 11 01/22/2024 1526   ALKPHOS 91 01/22/2024 1526   BILITOT 0.3 01/22/2024 1526      Component Value Date/Time   TSH 2.360 09/18/2023 0837   Nutritional Lab Results  Component Value Date   VD25OH 44.3 09/18/2023     ASSESSMENT AND PLAN  TREATMENT PLAN FOR OBESITY:  Recommended Dietary Goals  Nancy Stewart is currently in the action stage of change. As such, her goal is to continue weight management plan. She has agreed to the Category 2 Plan.  I've encouraged her to track and will review her macros at her next visit  App:  [email or web address]  Behavioral Intervention  We discussed the following Behavioral Modification Strategies today: increasing lean protein intake to established goals, decreasing simple carbohydrates , increasing vegetables, increasing fiber rich foods, increasing water intake , work on meal planning and preparation, reading food labels , keeping healthy foods at home, planning for success, continue to work on maintaining a reduced calorie state, getting the recommended amount of protein, incorporating whole foods, making healthy choices, staying well hydrated and practicing mindfulness when eating., and increase protein intake, fibrous foods (25 grams per day for women, 30 grams for men) and water to improve satiety and decrease hunger signals. .  Additional resources provided today: NA  Recommended Physical Activity Goals  Nancy Stewart has been advised to work up to 150 minutes of moderate intensity aerobic activity a week and strengthening exercises 2-3 times per week for cardiovascular health, weight loss maintenance and preservation of muscle mass.   She has agreed to Think about enjoyable ways to increase daily physical activity and overcoming barriers to exercise, Increase physical activity in their day and reduce sedentary time (increase NEAT)., Start  strengthening exercises with a goal of 2-3 sessions a week , Increase volume of physical activity to a goal of 240 minutes a week, and Combine aerobic and strengthening exercises for efficiency and improved cardiometabolic health.   ASSOCIATED CONDITIONS ADDRESSED TODAY  Action/Plan  Attention deficit hyperactivity disorder (ADHD), unspecified ADHD type To reach out to her psychiatrist to discuss her Adderall and side effects.  She felt she did better in 20mg .   Bruising Feels related to her job.   I've recommended following up with her PCP  Generalized obesity  BMI 29.0-29.9,adult     Goals: Increase water intake Start resistance training   Labs reviewed with patient from 01/22/24  Return in about 4 weeks (around 03/11/2024).Nancy Stewart She was informed of the importance of frequent follow up visits to maximize her  success with intensive lifestyle modifications for her multiple health conditions.   ATTESTASTION STATEMENTS:  Reviewed by clinician on day of visit: allergies, medications, problem list, medical history, surgical history, family history, social history, and previous encounter notes.   I personally spent a total of 25 minutes in the care of the patient today including preparing to see the patient, getting/reviewing separately obtained history, counseling and educating, documenting clinical information in the EHR, independently interpreting results, communicating results, and coordinating care.    Nancy Stewart SAUNDERS. Haisley Arens FNP-C

## 2024-02-13 ENCOUNTER — Ambulatory Visit: Admitting: Obstetrics and Gynecology

## 2024-02-28 ENCOUNTER — Encounter: Payer: Self-pay | Admitting: Nurse Practitioner

## 2024-02-28 ENCOUNTER — Ambulatory Visit: Admitting: Nurse Practitioner

## 2024-02-28 VITALS — BP 115/69 | HR 90 | Temp 98.0°F | Ht 62.0 in | Wt 154.0 lb

## 2024-02-28 DIAGNOSIS — E785 Hyperlipidemia, unspecified: Secondary | ICD-10-CM

## 2024-02-28 DIAGNOSIS — Z6828 Body mass index (BMI) 28.0-28.9, adult: Secondary | ICD-10-CM

## 2024-02-28 DIAGNOSIS — E669 Obesity, unspecified: Secondary | ICD-10-CM

## 2024-02-28 NOTE — Progress Notes (Signed)
 Office: [phone]  /  Fax: [phone]  WEIGHT SUMMARY AND BIOMETRICS  Weight Lost Since Last Visit: 8lb  Weight Gained Since Last Visit: 0lb   Vitals Temp: 98 F (36.7 C) BP: 115/69 Pulse Rate: 90 SpO2: 100 %   Anthropometric Measurements Height: 5' 2 (1.575 m) Weight: 154 lb (69.9 kg) BMI (Calculated): 28.16 Weight at Last Visit: 162lb Weight Lost Since Last Visit: 8lb Weight Gained Since Last Visit: 0lb Starting Weight: 176lb Total Weight Loss (lbs): 22 lb (9.979 kg)   Body Composition  Body Fat %: 34.4 % Fat Mass (lbs): 53 lbs Muscle Mass (lbs): 95.8 lbs Total Body Water (lbs): 69.8 lbs Visceral Fat Rating : 4   Other Clinical Data Fasting: Yes Labs: No Today's Visit #: 9 Starting Date: 09/18/23     HPI  Chief Complaint: OBESITY  Nancy Stewart is here to discuss her progress with her obesity treatment plan. She is on the the Category 2 Plan and states she is following her eating plan approximately 70 % of the time. She states she is exercising 60 minutes 4 days per week.   Interval History:  Since last office visit she has lost 8 pounds.  She is making healthier choices and watching her portion sizes.  She overall feels better since she has been eating healthier.  She notes the last visit, she got off track due to the holidays. She is not worried about gaining weight over Christmas.  She is drinking water and a protein shake daily.  She has changed her exercise regimen.  She is biking and jogging on an incline on the treadmill.  Her goal is to do a mud run in April. She is planning to start yoga.  She is no longer doing [employer].  Her bruising has improved since stopping [employer].    Pharmacotherapy for weight loss: She is not currently taking medications  for medical weight loss.   She is taking Adderall 30mg  daily prescribed by her psychiatrist. She feels that she is overall doing better with the  Adderall.  She feels a lot better.     Previous pharmacotherapy for medical weight loss:  None     Bariatric surgery:  Patient has not had bariatric surgery   Hyperlipidemia Medication(s): None.   Lab Results  Component Value Date   CHOL 204 (H) 09/18/2023   HDL 39 (L) 09/18/2023   LDLCALC 129 (H) 09/18/2023   TRIG 201 (H) 09/18/2023   CHOLHDL 4.1 06/06/2023   Lab Results  Component Value Date   ALT 11 01/22/2024   AST 17 01/22/2024   ALKPHOS 91 01/22/2024   BILITOT 0.3 01/22/2024   The ASCVD Risk score (Arnett DK, et al., 2019) failed to calculate for the following reasons:   The 2019 ASCVD risk score is only valid for ages 73 to 67   * - Cholesterol units were assumed   PHYSICAL EXAM:  Blood pressure 115/69, pulse 90, temperature 98 F (36.7 C), height 5' 2 (1.575 m), weight 154 lb (69.9 kg), last menstrual period 02/21/2024, SpO2 100%. Body mass index is 28.17 kg/m.  General: She is overweight, cooperative, alert, well developed, and in no acute distress. PSYCH: Has normal mood, affect and thought process.   Extremities: No edema.  Neurologic: No gross sensory or motor deficits. No tremors or fasciculations noted.    DIAGNOSTIC DATA REVIEWED:  BMET    Component Value Date/Time   NA 140 01/22/2024 1526   K 4.5 01/22/2024  1526   CL 101 01/22/2024 1526   CO2 26 01/22/2024 1526   GLUCOSE 86 01/22/2024 1526   BUN 9 01/22/2024 1526   CREATININE 0.80 01/22/2024 1526   CALCIUM 10.1 01/22/2024 1526   Lab Results  Component Value Date   HGBA1C 5.4 09/18/2023   HGBA1C 5.7 (H) 06/06/2023   Lab Results  Component Value Date   INSULIN  16.4 09/18/2023   Lab Results  Component Value Date   TSH 2.360 09/18/2023   CBC    Component Value Date/Time   WBC 8.3 01/22/2024 1526   RBC 4.31 01/22/2024 1526   HGB 12.6 01/22/2024 1526   HCT 39.4 01/22/2024 1526   PLT 292 01/22/2024 1526   MCV 91 01/22/2024 1526   MCH 29.2 01/22/2024 1526   MCHC 32.0 01/22/2024 1526   RDW 12.0 01/22/2024 1526   Iron  Studies    Component Value Date/Time   FERRITIN 51 09/18/2023 0837   Lipid Panel     Component Value Date/Time   CHOL 204 (H) 09/18/2023 0837   TRIG 201 (H) 09/18/2023 0837   HDL 39 (L) 09/18/2023 0837   CHOLHDL 4.1 06/06/2023 1048   LDLCALC 129 (H) 09/18/2023 0837   Hepatic Function Panel     Component Value Date/Time   PROT 7.0 01/22/2024 1526   ALBUMIN 4.6 01/22/2024 1526   AST 17 01/22/2024 1526   ALT 11 01/22/2024 1526   ALKPHOS 91 01/22/2024 1526   BILITOT 0.3 01/22/2024 1526      Component Value Date/Time   TSH 2.360 09/18/2023 0837   Nutritional Lab Results  Component Value Date   VD25OH 44.3 09/18/2023     ASSESSMENT AND PLAN  TREATMENT PLAN FOR OBESITY:  Recommended Dietary Goals  Tekeshia is currently in the action stage of change. As such, her goal is to continue weight management plan. She has agreed to keeping a food journal and adhering to recommended goals of 1400 calories and 80+ grams of protein.  Behavioral Intervention  We discussed the following Behavioral Modification Strategies today: increasing lean protein intake to established goals, decreasing simple carbohydrates , increasing vegetables, increasing fiber rich foods, increasing water intake , work on tracking and journaling calories using tracking application, reading food labels , keeping healthy foods at home, celebration eating strategies, continue to work on maintaining a reduced calorie state, getting the recommended amount of protein, incorporating whole foods, making healthy choices, staying well hydrated and practicing mindfulness when eating., and increase protein intake, fibrous foods (25 grams per day for women, 30 grams for men) and water to improve satiety and decrease hunger signals. .  Additional resources provided today: NA  Recommended Physical Activity Goals  Zamaya has been advised to work up to 150 minutes of moderate intensity aerobic activity a week and strengthening  exercises 2-3 times per week for cardiovascular health, weight loss maintenance and preservation of muscle mass.   She has agreed to Continue current level of physical activity , Think about enjoyable ways to increase daily physical activity and overcoming barriers to exercise, Increase physical activity in their day and reduce sedentary time (increase NEAT)., Start strengthening exercises with a goal of 2-3 sessions a week , Increase volume of physical activity to a goal of 240 minutes a week, and Combine aerobic and strengthening exercises for efficiency and improved cardiometabolic health.    ASSOCIATED CONDITIONS ADDRESSED TODAY  Action/Plan  Hyperlipidemia, unspecified hyperlipidemia type Cardiovascular risk and specific lipid/LDL goals reviewed.  We discussed several lifestyle modifications  today and Liala will continue to work on diet, exercise and weight loss efforts. Orders and follow up as documented in patient record.   Counseling Intensive lifestyle modifications are the first line treatment for this issue. Dietary changes: Increase soluble fiber. Decrease simple carbohydrates. Exercise changes: Moderate to vigorous-intensity aerobic activity 150 minutes per week if tolerated. Lipid-lowering medications: see documented in medical record.  Will recheck labs at next visit  Generalized obesity  BMI 28.0-28.9,adult       Will obtain labs at next visit  Bio Impedance reviewed with patient.  Needs to increase protein intake.  Will continue to monitor muscle mass.    Return in about 2 weeks (around 03/13/2024).SABRA She was informed of the importance of frequent follow up visits to maximize her success with intensive lifestyle modifications for her multiple health conditions.   ATTESTASTION STATEMENTS:  Reviewed by clinician on day of visit: allergies, medications, problem list, medical history, surgical history, family history, social history, and previous encounter notes.    I personally spent a total of 34 minutes in the care of the patient today including preparing to see the patient, getting/reviewing separately obtained history, performing a medically appropriate exam/evaluation, counseling and educating, and documenting clinical information in the EHR.    Corean SAUNDERS. Demarious Kapur FNP-C

## 2024-03-12 ENCOUNTER — Ambulatory Visit (INDEPENDENT_AMBULATORY_CARE_PROVIDER_SITE_OTHER): Admitting: Obstetrics and Gynecology

## 2024-03-12 ENCOUNTER — Encounter: Payer: Self-pay | Admitting: Obstetrics and Gynecology

## 2024-03-12 VITALS — BP 121/74 | HR 100 | Wt 158.0 lb

## 2024-03-12 DIAGNOSIS — Z30431 Encounter for routine checking of intrauterine contraceptive device: Secondary | ICD-10-CM | POA: Diagnosis not present

## 2024-03-12 NOTE — Progress Notes (Signed)
 Pt is in office for IUD check.  Pt states she has had brown spotting since insertion.

## 2024-03-12 NOTE — Progress Notes (Signed)
 18 yo P0 here for IUD check. Patient had IUD inserted 01/2024. She reports some occasional brown discharge. She denies pelvic pain. She has not been sexually active.   Past Medical History:  Diagnosis Date   ADD (attention deficit disorder)    Anxiety    Back pain    Bipolar 1 disorder (HCC)    Depression    Environmental and seasonal allergies    High cholesterol    PCOS (polycystic ovarian syndrome)    Pre-diabetes    Past Surgical History:  Procedure Laterality Date   MOUTH SURGERY     Family History  Problem Relation Age of Onset   Depression Mother    Anxiety disorder Mother    Bipolar disorder Mother    Alcoholism Mother    Depression Father    Sleep apnea Father    Alcoholism Father    Drug abuse Father    Cancer Maternal Aunt    Social History   Socioeconomic History   Marital status: Single    Spouse name: Not on file   Number of children: Not on file   Years of education: Not on file   Highest education level: Not on file  Occupational History   Not on file  Tobacco Use   Smoking status: Never    Passive exposure: Never   Smokeless tobacco: Never  Vaping Use   Vaping status: Never Used  Substance and Sexual Activity   Alcohol use: Never   Drug use: Never   Sexual activity: Not Currently    Birth control/protection: None  Other Topics Concern   Not on file  Social History Narrative   Not on file   Social Drivers of Health   Tobacco Use: Low Risk (03/12/2024)   Patient History    Smoking Tobacco Use: Never    Smokeless Tobacco Use: Never    Passive Exposure: Never  Financial Resource Strain: Low Risk (08/30/2021)   Received from [facility]   Overall Financial Resource Strain (CARDIA)    Difficulty of Paying Living Expenses: Not hard at all  Food Insecurity: Food Insecurity Present (05/31/2023)   Hunger Vital Sign    Worried About Running Out of Food in the Last Year: Sometimes true    Ran Out of Food in the Last Year: Sometimes true   Transportation Needs: Not on file  Physical Activity: Insufficiently Active (08/30/2021)   Received from [facility]   Exercise Vital Sign    On average, how many days per week do you engage in moderate to strenuous exercise (like a brisk walk)?: 3 days    On average, how many minutes do you engage in exercise at this level?: 30 min  Stress: Stress Concern Present (08/30/2021)   Received from [facility] - Occupational Stress Questionnaire    Feeling of Stress : To some extent  Social Connections: Moderately Integrated (08/30/2021)   Received from [facility]   Social Network    How would you rate your social network (family, work, friends)?: Adequate participation with social networks  Depression (PHQ2-9): Low Risk (11/28/2023)   Depression (PHQ2-9)    PHQ-2 Score: 3  Alcohol Screen: Not on file  Housing: Not on file  Utilities: Not on file  Health Literacy: Not on file   ROS See pertinent in HPI. All other systems reviewed and non contributory Blood pressure 121/74, pulse 100, weight 158 lb (71.7 kg), last menstrual period 02/21/2024. GENERAL: Well-developed, well-nourished female in  no acute distress.  PELVIC: Normal external female genitalia. Vagina is pink and rugated.  Normal discharge. Normal appearing cervix with IUD strings visualized at the os and curled into the posterior fornix. Chaperone present during the pelvic exam NEURO: alert and oriented x 3  A/P 18 yo P0 here for IUD check - IUD appears to be in the appropriate location - Reassurance provided - RTC in 4-6 months after her 19st birthday for annual exam with pap smear

## 2024-03-13 ENCOUNTER — Ambulatory Visit: Admitting: Nurse Practitioner

## 2024-03-13 ENCOUNTER — Encounter: Payer: Self-pay | Admitting: Nurse Practitioner

## 2024-03-13 VITALS — BP 122/78 | HR 78 | Temp 98.4°F | Ht 62.0 in | Wt 154.0 lb

## 2024-03-13 DIAGNOSIS — E669 Obesity, unspecified: Secondary | ICD-10-CM | POA: Diagnosis not present

## 2024-03-13 DIAGNOSIS — R632 Polyphagia: Secondary | ICD-10-CM

## 2024-03-13 DIAGNOSIS — Z6828 Body mass index (BMI) 28.0-28.9, adult: Secondary | ICD-10-CM | POA: Diagnosis not present

## 2024-03-13 NOTE — Progress Notes (Signed)
 "  Office: [phone]  /  Fax: [phone]  WEIGHT SUMMARY AND BIOMETRICS  Weight Lost Since Last Visit: 0lb  Weight Gained Since Last Visit: 0lb   Vitals Temp: 98.4 F (36.9 C) BP: 122/78 Pulse Rate: 78 SpO2: 97 %   Anthropometric Measurements Height: 5' 2 (1.575 m) Weight: 154 lb (69.9 kg) BMI (Calculated): 28.16 Weight at Last Visit: 154lb Weight Lost Since Last Visit: 0lb Weight Gained Since Last Visit: 0lb Starting Weight: 176lb Total Weight Loss (lbs): 22 lb (9.979 kg)   Body Composition  Body Fat %: 36.2 % Fat Mass (lbs): 55.8 lbs Muscle Mass (lbs): 93.4 lbs Total Body Water (lbs): 72.2 lbs Visceral Fat Rating : 4   Other Clinical Data Fasting: Yes Labs: Yes Today's Visit #: 10 Starting Date: 09/18/23     HPI  Chief Complaint: OBESITY  Nancy Stewart is here to discuss her progress with her obesity treatment plan. She is on the the Category 2 Plan and states she is following her eating plan approximately 80 % of the time. She states she is exercising 60-90 minutes 4 days per week.   Interval History:  Since last office visit she has maintained her weight.  She is drinking water and a protein shake daily.  She is going to the gym 4 days per week-doing spin class and resistance training (arms).  She is starting piliates next week.  She feels that she is overall doing better concerning exercising.  She notes some polyphaga   Pharmacotherapy for weight loss: She is not currently taking medications  for medical weight loss.    She is taking Adderall 30mg  daily prescribed by her psychiatrist.    Previous pharmacotherapy for medical weight loss:  None     Bariatric surgery:  Patient has not had bariatric surgery    PHYSICAL EXAM:  Blood pressure 122/78, pulse 78, temperature 98.4 F (36.9 C), height 5' 2 (1.575 m), weight 154 lb (69.9 kg), last menstrual period 02/21/2024, SpO2 97%. Body mass index is 28.17 kg/m.  General: She is overweight,  cooperative, alert, well developed, and in no acute distress. PSYCH: Has normal mood, affect and thought process.   Extremities: No edema.  Neurologic: No gross sensory or motor deficits. No tremors or fasciculations noted.    DIAGNOSTIC DATA REVIEWED:  BMET    Component Value Date/Time   NA 140 01/22/2024 1526   K 4.5 01/22/2024 1526   CL 101 01/22/2024 1526   CO2 26 01/22/2024 1526   GLUCOSE 86 01/22/2024 1526   BUN 9 01/22/2024 1526   CREATININE 0.80 01/22/2024 1526   CALCIUM 10.1 01/22/2024 1526   Lab Results  Component Value Date   HGBA1C 5.4 09/18/2023   HGBA1C 5.7 (H) 06/06/2023   Lab Results  Component Value Date   INSULIN  16.4 09/18/2023   Lab Results  Component Value Date   TSH 2.360 09/18/2023   CBC    Component Value Date/Time   WBC 8.3 01/22/2024 1526   RBC 4.31 01/22/2024 1526   HGB 12.6 01/22/2024 1526   HCT 39.4 01/22/2024 1526   PLT 292 01/22/2024 1526   MCV 91 01/22/2024 1526   MCH 29.2 01/22/2024 1526   MCHC 32.0 01/22/2024 1526   RDW 12.0 01/22/2024 1526   Iron Studies    Component Value Date/Time   FERRITIN 51 09/18/2023 0837   Lipid Panel     Component Value Date/Time   CHOL 204 (H) 09/18/2023 0837   TRIG 201 (H) 09/18/2023 9162  HDL 39 (L) 09/18/2023 0837   CHOLHDL 4.1 06/06/2023 1048   LDLCALC 129 (H) 09/18/2023 0837   Hepatic Function Panel     Component Value Date/Time   PROT 7.0 01/22/2024 1526   ALBUMIN 4.6 01/22/2024 1526   AST 17 01/22/2024 1526   ALT 11 01/22/2024 1526   ALKPHOS 91 01/22/2024 1526   BILITOT 0.3 01/22/2024 1526      Component Value Date/Time   TSH 2.360 09/18/2023 0837   Nutritional Lab Results  Component Value Date   VD25OH 44.3 09/18/2023     ASSESSMENT AND PLAN  TREATMENT PLAN FOR OBESITY:  Recommended Dietary Goals  Nancy Stewart is currently in the action stage of change. As such, her goal is to continue weight management plan. She has agreed to the Category 2 Plan.  I've recommended  that she track and will review macros at her next visit  Behavioral Intervention  We discussed the following Behavioral Modification Strategies today: increasing lean protein intake to established goals, decreasing simple carbohydrates , increasing vegetables, increasing fiber rich foods, increasing water intake , work on meal planning and preparation, reading food labels , keeping healthy foods at home, planning for success, celebration eating strategies, continue to work on maintaining a reduced calorie state, getting the recommended amount of protein, incorporating whole foods, making healthy choices, staying well hydrated and practicing mindfulness when eating., and increase protein intake, fibrous foods (25 grams per day for women, 30 grams for men) and water to improve satiety and decrease hunger signals. .  Additional resources provided today: NA  Recommended Physical Activity Goals  Nancy Stewart has been advised to work up to 150 minutes of moderate intensity aerobic activity a week and strengthening exercises 2-3 times per week for cardiovascular health, weight loss maintenance and preservation of muscle mass.   She has agreed to Think about enjoyable ways to increase daily physical activity and overcoming barriers to exercise, Increase physical activity in their day and reduce sedentary time (increase NEAT)., Continue to gradually increase the amount and intensity of exercise routine, Increase volume of physical activity to a goal of 240 minutes a week, and Combine aerobic and strengthening exercises for efficiency and improved cardiometabolic health.   Pharmacotherapy  Patient has an IUD  Avoid Phentermine and Qsymia due to taking Adderall and Lamictal    ASSOCIATED CONDITIONS ADDRESSED TODAY  Action/Plan  Polyphagia Intensive lifestyle modifications are the first line treatment for this issue. We discussed several lifestyle modifications today and she will continue to work on diet,  exercise and weight loss efforts. Orders and follow up as documented in patient record.  Counseling Polyphagia is excessive hunger. Causes can include: low blood sugars, hypERthyroidism, PMS, lack of sleep, stress, insulin  resistance, diabetes, certain medications, and diets that are deficient in protein and fiber.   Generalized obesity  BMI 28.0-28.9,adult      Patient is not fasting, will check labs at next visit.   Plan: To start resistance training 3 days per week Increase water intake Increase protein intake   Return in about 3 weeks (around 04/03/2024).SABRA She was informed of the importance of frequent follow up visits to maximize her success with intensive lifestyle modifications for her multiple health conditions.   ATTESTASTION STATEMENTS:  Reviewed by clinician on day of visit: allergies, medications, problem list, medical history, surgical history, family history, social history, and previous encounter notes.   I personally spent a total of 32 minutes in the care of the patient today including preparing to see  the patient, getting/reviewing separately obtained history, counseling and educating, and documenting clinical information in the EHR.    Corean SAUNDERS. Usiel Astarita FNP-C "

## 2024-03-19 ENCOUNTER — Encounter: Payer: Self-pay | Admitting: Nurse Practitioner

## 2024-03-27 ENCOUNTER — Encounter: Payer: Self-pay | Admitting: Nurse Practitioner

## 2024-03-27 ENCOUNTER — Other Ambulatory Visit: Payer: Self-pay | Admitting: Nurse Practitioner

## 2024-03-27 DIAGNOSIS — E88819 Insulin resistance, unspecified: Secondary | ICD-10-CM

## 2024-03-27 DIAGNOSIS — E785 Hyperlipidemia, unspecified: Secondary | ICD-10-CM

## 2024-03-27 DIAGNOSIS — R7303 Prediabetes: Secondary | ICD-10-CM

## 2024-03-29 LAB — LIPID PANEL WITH LDL/HDL RATIO
Cholesterol, Total: 155 mg/dL (ref 100–199)
HDL: 34 mg/dL — ABNORMAL LOW
LDL Chol Calc (NIH): 106 mg/dL — ABNORMAL HIGH (ref 0–99)
LDL/HDL Ratio: 3.1 ratio (ref 0.0–3.2)
Triglycerides: 76 mg/dL (ref 0–149)
VLDL Cholesterol Cal: 15 mg/dL (ref 5–40)

## 2024-03-29 LAB — HEMOGLOBIN A1C
Est. average glucose Bld gHb Est-mCnc: 105 mg/dL
Hgb A1c MFr Bld: 5.3 % (ref 4.8–5.6)

## 2024-03-29 LAB — INSULIN, RANDOM: INSULIN: 17.2 u[IU]/mL (ref 2.6–24.9)

## 2024-04-03 ENCOUNTER — Encounter: Payer: Self-pay | Admitting: Nurse Practitioner

## 2024-04-03 ENCOUNTER — Ambulatory Visit: Admitting: Nurse Practitioner

## 2024-04-03 VITALS — BP 127/87 | HR 94 | Temp 99.0°F | Ht 62.0 in | Wt 148.0 lb

## 2024-04-03 DIAGNOSIS — R632 Polyphagia: Secondary | ICD-10-CM

## 2024-04-03 DIAGNOSIS — Z6827 Body mass index (BMI) 27.0-27.9, adult: Secondary | ICD-10-CM | POA: Diagnosis not present

## 2024-04-03 DIAGNOSIS — E785 Hyperlipidemia, unspecified: Secondary | ICD-10-CM

## 2024-04-03 DIAGNOSIS — R7303 Prediabetes: Secondary | ICD-10-CM | POA: Diagnosis not present

## 2024-04-03 DIAGNOSIS — E669 Obesity, unspecified: Secondary | ICD-10-CM | POA: Diagnosis not present

## 2024-04-03 NOTE — Progress Notes (Signed)
 "  Office: [phone]  /  Fax: [phone]  WEIGHT SUMMARY AND BIOMETRICS  Weight Lost Since Last Visit: 6lb  Weight Gained Since Last Visit: 0lb   Vitals Temp: 99 F (37.2 C) BP: 127/87 Pulse Rate: 94 SpO2: 99 %   Anthropometric Measurements Height: 5' 2 (1.575 m) Weight: 148 lb (67.1 kg) BMI (Calculated): 27.06 Weight at Last Visit: 154lb Weight Lost Since Last Visit: 6lb Weight Gained Since Last Visit: 0lb Starting Weight: 176lb Total Weight Loss (lbs): 28 lb (12.7 kg)   Body Composition  Body Fat %: 36 % Fat Mass (lbs): 53.6 lbs Muscle Mass (lbs): 90.4 lbs Total Body Water (lbs): 71.2 lbs Visceral Fat Rating : 4   Other Clinical Data Fasting: No Labs: No Today's Visit #: 11 Starting Date: 09/18/23     HPI  Chief Complaint: OBESITY  Amaka is here to discuss her progress with her obesity treatment plan. She is on the the Category 2 Plan and states she is following her eating plan approximately 80 % of the time. She states she is exercising 60-90 minutes 4 days per week.   Interval History:  Since last office visit she has lost 6 pounds. She is struggling with meeting her calories and protein goals.  She is drinking water, atkins protein shake and occ diet soda.  She is going to the gym 4 days per week-2 days of piliates, cardio and resistance training (arms).    BF:  2-3 eggs with ham, cheese and yogurt Lunch:  fruit with frozen bowl Dinner:  protein with a vegetable and/or fruit Snack:  apple with cottage cheese  Pharmacotherapy for weight loss: She is not currently taking medications  for medical weight loss.     She is taking Adderall XR 20mg  BID daily prescribed by her psychiatrist.    Previous pharmacotherapy for medical weight loss:  None     Bariatric surgery:  Patient has not had bariatric surgery   Prediabetes Last A1c was 5.3-improved  Medication(s): none Polyphagia:No Lab Results  Component Value Date   HGBA1C 5.3  03/28/2024   HGBA1C 5.4 09/18/2023   HGBA1C 5.7 (H) 06/06/2023   Lab Results  Component Value Date   INSULIN  17.2 03/28/2024   INSULIN  16.4 09/18/2023    Hyperlipidemia Medication(s): None-improved.  Lab Results  Component Value Date   CHOL 155 03/28/2024   HDL 34 (L) 03/28/2024   LDLCALC 106 (H) 03/28/2024   TRIG 76 03/28/2024   CHOLHDL 4.1 06/06/2023   Lab Results  Component Value Date   ALT 11 01/22/2024   AST 17 01/22/2024   ALKPHOS 91 01/22/2024   BILITOT 0.3 01/22/2024   The ASCVD Risk score (Arnett DK, et al., 2019) failed to calculate for the following reasons:   The 2019 ASCVD risk score is only valid for ages 62 to 39   * - Cholesterol units were assumed    PHYSICAL EXAM:  Blood pressure 127/87, pulse 94, temperature 99 F (37.2 C), height 5' 2 (1.575 m), weight 148 lb (67.1 kg), SpO2 99%. Body mass index is 27.07 kg/m.  General: She is overweight, cooperative, alert, well developed, and in no acute distress. PSYCH: Has normal mood, affect and thought process.   Extremities: No edema.  Neurologic: No gross sensory or motor deficits. No tremors or fasciculations noted.    DIAGNOSTIC DATA REVIEWED:  BMET    Component Value Date/Time   NA 140 01/22/2024 1526   K 4.5 01/22/2024 1526   CL 101 01/22/2024  1526   CO2 26 01/22/2024 1526   GLUCOSE 86 01/22/2024 1526   BUN 9 01/22/2024 1526   CREATININE 0.80 01/22/2024 1526   CALCIUM 10.1 01/22/2024 1526   Lab Results  Component Value Date   HGBA1C 5.3 03/28/2024   HGBA1C 5.7 (H) 06/06/2023   Lab Results  Component Value Date   INSULIN  17.2 03/28/2024   INSULIN  16.4 09/18/2023   Lab Results  Component Value Date   TSH 2.360 09/18/2023   CBC    Component Value Date/Time   WBC 8.3 01/22/2024 1526   RBC 4.31 01/22/2024 1526   HGB 12.6 01/22/2024 1526   HCT 39.4 01/22/2024 1526   PLT 292 01/22/2024 1526   MCV 91 01/22/2024 1526   MCH 29.2 01/22/2024 1526   MCHC 32.0 01/22/2024 1526   RDW  12.0 01/22/2024 1526   Iron Studies    Component Value Date/Time   FERRITIN 51 09/18/2023 0837   Lipid Panel     Component Value Date/Time   CHOL 155 03/28/2024 0938   TRIG 76 03/28/2024 0938   HDL 34 (L) 03/28/2024 0938   CHOLHDL 4.1 06/06/2023 1048   LDLCALC 106 (H) 03/28/2024 0938   Hepatic Function Panel     Component Value Date/Time   PROT 7.0 01/22/2024 1526   ALBUMIN 4.6 01/22/2024 1526   AST 17 01/22/2024 1526   ALT 11 01/22/2024 1526   ALKPHOS 91 01/22/2024 1526   BILITOT 0.3 01/22/2024 1526      Component Value Date/Time   TSH 2.360 09/18/2023 0837   Nutritional Lab Results  Component Value Date   VD25OH 44.3 09/18/2023     ASSESSMENT AND PLAN  TREATMENT PLAN FOR OBESITY:  Recommended Dietary Goals  Senaya is currently in the maintenance phase. As such, her goal is to continue weight management plan. She has agreed to 1400-1500 calories and 80+ grams of protein.    Behavioral Intervention  We discussed the following Behavioral Modification Strategies today: increasing lean protein intake to established goals, decreasing simple carbohydrates , increasing vegetables, increasing fiber rich foods, increasing water intake , work on meal planning and preparation, reading food labels , keeping healthy foods at home, continue to work on maintaining a reduced calorie state, getting the recommended amount of protein, incorporating whole foods, making healthy choices, staying well hydrated and practicing mindfulness when eating., and increase protein intake, fibrous foods (25 grams per day for women, 30 grams for men) and water to improve satiety and decrease hunger signals. .  Additional resources provided today: NA  Recommended Physical Activity Goals  Marylynne has been advised to work up to 150 minutes of moderate intensity aerobic activity a week and strengthening exercises 2-3 times per week for cardiovascular health, weight loss maintenance and preservation  of muscle mass.   She has agreed to Think about enjoyable ways to increase daily physical activity and overcoming barriers to exercise, Increase physical activity in their day and reduce sedentary time (increase NEAT)., Continue to gradually increase the amount and intensity of exercise routine, Increase volume of physical activity to a goal of 240 minutes a week, and Combine aerobic and strengthening exercises for efficiency and improved cardiometabolic health.   Pharmacotherapy Patient has an IUD   Avoid Phentermine and Qsymia due to taking Adderall and Lamictal   ASSOCIATED CONDITIONS ADDRESSED TODAY  Action/Plan  Prediabetes Labs reviewed with patient Improved Will continue to monitor  Dyslipidemia with high LDL and low HDL Labs reviewed with patient Improved Will continue to  monitor  Polyphagia Struggling with meeting calorie goals.  Her Adderall XR was recently increased, to discuss with psychiatry.    Generalized obesity  BMI 27.0-27.9,adult Has overall done well with weight loss.  To work now on information systems manager      Goals: Adding in more resistance training   Return in about 6 weeks (around 05/15/2024).SABRA She was informed of the importance of frequent follow up visits to maximize her success with intensive lifestyle modifications for her multiple health conditions.   ATTESTASTION STATEMENTS:  Reviewed by clinician on day of visit: allergies, medications, problem list, medical history, surgical history, family history, social history, and previous encounter notes.   I personally spent a total of 30 minutes in the care of the patient today including preparing to see the patient, getting/reviewing separately obtained history, performing a medically appropriate exam/evaluation, counseling and educating, documenting clinical information in the EHR, independently interpreting results, and communicating results.    Corean SAUNDERS.  Laquesha Holcomb FNP-C "

## 2024-05-06 ENCOUNTER — Ambulatory Visit: Admitting: Nurse Practitioner

## 2024-06-07 ENCOUNTER — Encounter: Admitting: Family Medicine
